# Patient Record
Sex: Female | Born: 1975
Health system: Southern US, Community
[De-identification: ages and names within clinical notes are randomized; demographics above are authoritative.]

## PROBLEM LIST (undated history)

## (undated) ENCOUNTER — Inpatient Hospital Stay (HOSPITAL_COMMUNITY): Payer: Self-pay

## (undated) DIAGNOSIS — J45909 Unspecified asthma, uncomplicated: Secondary | ICD-10-CM

## (undated) DIAGNOSIS — F988 Other specified behavioral and emotional disorders with onset usually occurring in childhood and adolescence: Secondary | ICD-10-CM

## (undated) DIAGNOSIS — Z9889 Other specified postprocedural states: Secondary | ICD-10-CM

## (undated) DIAGNOSIS — F419 Anxiety disorder, unspecified: Secondary | ICD-10-CM

## (undated) DIAGNOSIS — R112 Nausea with vomiting, unspecified: Secondary | ICD-10-CM

## (undated) DIAGNOSIS — R87629 Unspecified abnormal cytological findings in specimens from vagina: Secondary | ICD-10-CM

## (undated) HISTORY — PX: LEEP: SHX91

## (undated) HISTORY — DX: Unspecified abnormal cytological findings in specimens from vagina: R87.629

## (undated) HISTORY — PX: OTHER SURGICAL HISTORY: SHX169

## (undated) HISTORY — PX: APPENDECTOMY: SHX54

## (undated) HISTORY — PX: RHINOPLASTY: SUR1284

## (undated) HISTORY — PX: WISDOM TOOTH EXTRACTION: SHX21

## (undated) HISTORY — DX: Other specified behavioral and emotional disorders with onset usually occurring in childhood and adolescence: F98.8

---

## 1999-10-25 ENCOUNTER — Encounter (INDEPENDENT_AMBULATORY_CARE_PROVIDER_SITE_OTHER): Payer: Self-pay | Admitting: Specialist

## 1999-10-25 ENCOUNTER — Other Ambulatory Visit: Admission: RE | Admit: 1999-10-25 | Discharge: 1999-10-25 | Payer: Self-pay | Admitting: Gynecology

## 1999-11-16 ENCOUNTER — Encounter (INDEPENDENT_AMBULATORY_CARE_PROVIDER_SITE_OTHER): Payer: Self-pay | Admitting: Specialist

## 1999-11-16 ENCOUNTER — Ambulatory Visit (HOSPITAL_COMMUNITY): Admission: RE | Admit: 1999-11-16 | Discharge: 1999-11-16 | Payer: Self-pay | Admitting: Gynecology

## 2000-07-23 ENCOUNTER — Other Ambulatory Visit: Admission: RE | Admit: 2000-07-23 | Discharge: 2000-07-23 | Payer: Self-pay | Admitting: Gynecology

## 2000-07-24 ENCOUNTER — Encounter (INDEPENDENT_AMBULATORY_CARE_PROVIDER_SITE_OTHER): Payer: Self-pay

## 2000-07-24 ENCOUNTER — Other Ambulatory Visit: Admission: RE | Admit: 2000-07-24 | Discharge: 2000-07-24 | Payer: Self-pay | Admitting: Gynecology

## 2001-01-22 ENCOUNTER — Other Ambulatory Visit: Admission: RE | Admit: 2001-01-22 | Discharge: 2001-01-22 | Payer: Self-pay | Admitting: Gynecology

## 2001-11-02 ENCOUNTER — Other Ambulatory Visit: Admission: RE | Admit: 2001-11-02 | Discharge: 2001-11-02 | Payer: Self-pay | Admitting: *Deleted

## 2001-12-17 ENCOUNTER — Inpatient Hospital Stay (HOSPITAL_COMMUNITY): Admission: AD | Admit: 2001-12-17 | Discharge: 2001-12-17 | Payer: Self-pay | Admitting: Gynecology

## 2002-03-24 ENCOUNTER — Inpatient Hospital Stay (HOSPITAL_COMMUNITY): Admission: AD | Admit: 2002-03-24 | Discharge: 2002-03-26 | Payer: Self-pay | Admitting: Gynecology

## 2002-03-27 ENCOUNTER — Encounter: Admission: RE | Admit: 2002-03-27 | Discharge: 2002-04-26 | Payer: Self-pay | Admitting: Gynecology

## 2002-04-27 ENCOUNTER — Encounter: Admission: RE | Admit: 2002-04-27 | Discharge: 2002-05-27 | Payer: Self-pay | Admitting: Gynecology

## 2003-03-28 ENCOUNTER — Other Ambulatory Visit: Admission: RE | Admit: 2003-03-28 | Discharge: 2003-03-28 | Payer: Self-pay | Admitting: Obstetrics and Gynecology

## 2004-04-24 ENCOUNTER — Other Ambulatory Visit: Admission: RE | Admit: 2004-04-24 | Discharge: 2004-04-24 | Payer: Self-pay | Admitting: Obstetrics and Gynecology

## 2004-06-02 ENCOUNTER — Inpatient Hospital Stay (HOSPITAL_COMMUNITY): Admission: AD | Admit: 2004-06-02 | Discharge: 2004-06-02 | Payer: Self-pay | Admitting: Obstetrics and Gynecology

## 2004-06-20 ENCOUNTER — Ambulatory Visit (HOSPITAL_COMMUNITY): Admission: RE | Admit: 2004-06-20 | Discharge: 2004-06-20 | Payer: Self-pay | Admitting: Obstetrics and Gynecology

## 2004-11-23 ENCOUNTER — Inpatient Hospital Stay (HOSPITAL_COMMUNITY): Admission: AD | Admit: 2004-11-23 | Discharge: 2004-11-23 | Payer: Self-pay | Admitting: Obstetrics and Gynecology

## 2004-12-14 ENCOUNTER — Inpatient Hospital Stay (HOSPITAL_COMMUNITY): Admission: AD | Admit: 2004-12-14 | Discharge: 2004-12-17 | Payer: Self-pay | Admitting: Obstetrics and Gynecology

## 2004-12-21 ENCOUNTER — Encounter: Admission: RE | Admit: 2004-12-21 | Discharge: 2005-01-20 | Payer: Self-pay | Admitting: Obstetrics and Gynecology

## 2004-12-21 ENCOUNTER — Inpatient Hospital Stay (HOSPITAL_COMMUNITY): Admission: AD | Admit: 2004-12-21 | Discharge: 2004-12-21 | Payer: Self-pay | Admitting: Obstetrics and Gynecology

## 2006-06-25 ENCOUNTER — Other Ambulatory Visit: Admission: RE | Admit: 2006-06-25 | Discharge: 2006-06-25 | Payer: Self-pay | Admitting: Obstetrics and Gynecology

## 2009-05-10 ENCOUNTER — Ambulatory Visit (HOSPITAL_COMMUNITY): Admission: EM | Admit: 2009-05-10 | Discharge: 2009-05-11 | Payer: Self-pay | Admitting: Emergency Medicine

## 2009-05-11 ENCOUNTER — Encounter (INDEPENDENT_AMBULATORY_CARE_PROVIDER_SITE_OTHER): Payer: Self-pay | Admitting: General Surgery

## 2010-05-27 ENCOUNTER — Emergency Department (HOSPITAL_COMMUNITY)
Admission: EM | Admit: 2010-05-27 | Discharge: 2010-05-27 | Payer: Self-pay | Source: Home / Self Care | Admitting: Emergency Medicine

## 2010-05-29 ENCOUNTER — Ambulatory Visit (HOSPITAL_BASED_OUTPATIENT_CLINIC_OR_DEPARTMENT_OTHER)
Admission: RE | Admit: 2010-05-29 | Discharge: 2010-05-29 | Payer: Self-pay | Source: Home / Self Care | Admitting: Otolaryngology

## 2010-11-01 LAB — POCT HEMOGLOBIN-HEMACUE: Hemoglobin: 12.9 g/dL (ref 12.0–15.0)

## 2010-11-23 LAB — COMPREHENSIVE METABOLIC PANEL
ALT: 14 U/L (ref 0–35)
AST: 16 U/L (ref 0–37)
Albumin: 4.5 g/dL (ref 3.5–5.2)
Alkaline Phosphatase: 40 U/L (ref 39–117)
BUN: 14 mg/dL (ref 6–23)
CO2: 27 mEq/L (ref 19–32)
Calcium: 9.5 mg/dL (ref 8.4–10.5)
Chloride: 103 mEq/L (ref 96–112)
Creatinine, Ser: 0.83 mg/dL (ref 0.4–1.2)
GFR calc Af Amer: >60 mL/min (ref >60)
GFR calc non Af Amer: >60 mL/min (ref >60)
Glucose, Bld: 97 mg/dL (ref 70–99)
Potassium: 3.7 mEq/L (ref 3.5–5.1)
Sodium: 139 mEq/L (ref 135–145)
Total Bilirubin: 1.3 mg/dL — ABNORMAL HIGH (ref 0.3–1.2)
Total Protein: 7.5 g/dL (ref 6.0–8.3)

## 2010-11-23 LAB — DIFFERENTIAL
Basophils Absolute: 0 10*3/uL (ref 0.0–0.1)
Basophils Relative: 0 % (ref 0–1)
Eosinophils Absolute: 0.2 10*3/uL (ref 0.0–0.7)
Eosinophils Relative: 1 % (ref 0–5)
Lymphocytes Relative: 9 % — ABNORMAL LOW (ref 12–46)
Lymphs Abs: 1.2 10*3/uL (ref 0.7–4.0)
Monocytes Absolute: 0.9 10*3/uL (ref 0.1–1.0)
Monocytes Relative: 7 % (ref 3–12)
Neutro Abs: 10.4 10*3/uL — ABNORMAL HIGH (ref 1.7–7.7)
Neutrophils Relative %: 82 % — ABNORMAL HIGH (ref 43–77)

## 2010-11-23 LAB — URINALYSIS, ROUTINE W REFLEX MICROSCOPIC
Bilirubin Urine: NEGATIVE
Glucose, UA: NEGATIVE mg/dL
Hgb urine dipstick: NEGATIVE
Ketones, ur: NEGATIVE mg/dL
Nitrite: NEGATIVE
Protein, ur: NEGATIVE mg/dL
Specific Gravity, Urine: 1.022 (ref 1.005–1.030)
Urobilinogen, UA: 1 mg/dL (ref 0.0–1.0)
pH: 8 (ref 5.0–8.0)

## 2010-11-23 LAB — TYPE AND SCREEN
ABO/RH(D): O POS
Antibody Screen: NEGATIVE

## 2010-11-23 LAB — CBC
HCT: 38.8 % (ref 36.0–46.0)
Hemoglobin: 13.3 g/dL (ref 12.0–15.0)
MCHC: 34.2 g/dL (ref 30.0–36.0)
MCV: 100.6 fL — ABNORMAL HIGH (ref 78.0–100.0)
Platelets: 252 10*3/uL (ref 150–400)
RBC: 3.86 MIL/uL — ABNORMAL LOW (ref 3.87–5.11)
RDW: 12.7 % (ref 11.5–15.5)
WBC: 12.7 10*3/uL — ABNORMAL HIGH (ref 4.0–10.5)

## 2010-11-23 LAB — ABO/RH: ABO/RH(D): O POS

## 2010-11-23 LAB — PROTIME-INR
INR: 1.2 (ref 0.00–1.49)
Prothrombin Time: 15.3 seconds — ABNORMAL HIGH (ref 11.6–15.2)

## 2010-11-23 LAB — POCT PREGNANCY, URINE: Preg Test, Ur: NEGATIVE

## 2010-11-23 LAB — LIPASE, BLOOD: Lipase: 21 U/L (ref 11–59)

## 2010-11-23 LAB — APTT: aPTT: 31 seconds (ref 24–37)

## 2011-01-04 NOTE — H&P (Signed)
Stacey [name], Stacey [name]                ACCOUNT NO.:  [id number]   MEDICAL RECORD NO.:  [id number]          PATIENT TYPE:  INP   LOCATION:  9164                          [facility]ILITY:  [facility]   PHYSICIAN:  Naima A. Dillard, M.D. DATE OF BIRTH:  07/07/1976   DATE OF ADMISSION:  12/14/2004  DATE OF DISCHARGE:                                HISTORY & PHYSICAL   Ms. Stacey [name] is a 35 year old, married, white female, gravida 2, para 1-0-0-  1, at 39-0/7th weeks, who presents complaining of leaking large vaginal  fluid at around 9 p.m. with onset of uterine contractions of increased  intensity and frequency since about 10 p.m.  She was 4-cm at the office  yesterday.  She denies any nausea, vomiting, headaches, or visual  disturbances.   Her pregnancy has been followed at [facility]  by the M.D.  service and has been essentially uncomplicated though at risk for:  1.  First trimester spotting.  2.  History of asthma.  3.  History of cold knife conization of her cervix in 2001.  4.  History of some social issues throughout this pregnancy though currently      improving.   Her Group B strep is negative.   OBSTETRICAL/GYNECOLOGIC HISTORY:  1.  She is a gravida 2, para 1-0-0-1, who delivered a viable female infant, in      August 2003, who weighed 6 pounds 8 ounces at 39 weeks' gestation      without complication.  2.  Abnormal Pap in 2000, followed by a LEEP procedure and possibly a cold      knife conization.  3.  She was diagnosed with HPV in 1997.   GENERAL MEDICAL HISTORY:  1.  She has no known drug allergies.  2.  She reports having had the usual childhood diseases.  3.  She has a history of asthma with occasional use of inhaler.  4.  History of anemia.  5.  History of stomach sensitivity.  6.  Her only hospitalization was for childbirth.   FAMILY HISTORY:  Significant for maternal grandfather with MI.  Mother with  hypertension on medications.  Mother and paternal aunt and  paternal  grandmother with varicosities.  Maternal grandmother with emphysema.  Mother  with premenopausal breast cancer.   GENETIC HISTORY:  Negative.   SOCIAL HISTORY:  She is married to [employer] who is involved and  supportive.  They are both employed full time.  They deny any illicit drug  use, alcohol, or smoking with this pregnancy.  They are of the [place]  faith.   PRENATAL LABS:  Her blood type is O positive.  Her antibody screen is  negative.  Syphilis is nonreactive.  Rubella is immune.  Hepatitis B surface  antigen is negative.  GC and Chlamydia are both negative.  Pap was within  normal limits.  Her 36-week beta strep was negative.   PHYSICAL EXAMINATION:  VITAL SIGNS:  Stable.  She is afebrile.  HEENT:  Grossly within normal limits.  HEART:  Regular rhythm and rate.  CHEST:  Clear.  BREASTS:  Soft and nontender.  ABDOMEN:  Gravid with uterine contractions every three to four minutes.  The  fetal heart rate is reactive and reassuring.  PELVIC:  Is 6-cm, vertex, 90%, with clear fluid noted per the RN on labor  and delivery.  EXTREMITIES:  Within normal limits.   ASSESSMENT:  1.  Intrauterine pregnancy at 46 weeks' gestation.  2.  Spontaneous rupture of the membranes with clear fluid.  3.  Active labor.  4.  Desires epidural for labor.  5.  Negative Group B strep.   PLAN:  1.  Admit to labor and delivery.  2.  Follow routine M.D. orders.  3.  Dr. Normand Sloop has been notified of the patient's admission and will follow      the patient.      SJD/MEDQ  D:  12/14/2004  T:  12/14/2004  Job:  [id number]

## 2011-01-04 NOTE — H&P (Signed)
Stacey [name], Stacey [name]                          ACCOUNT NO.:  [id number]   MEDICAL RECORD NO.:  [id number]                   PATIENT TYPE:  INP   LOCATION:  9163                                 [facility]   PHYSICIAN:  Juan H. Lily Peer, M.D.             DATE OF BIRTH:  12/30/1975   DATE OF ADMISSION:  03/24/2002  DATE OF DISCHARGE:                                HISTORY & PHYSICAL   CHIEF COMPLAINT:  Labor.   HISTORY OF PRESENT ILLNESS:  The patient is a 35 year old gravida 1, para 1,  with a corrected estimated date of confinement being March 29, 2002.  The  patient currently is 39-1/2 weeks intrauterine pregnancy.  Was seen in the  office yesterday, was 2 cm dilated, had her membranes stripped and had  bloody showing during the evening and presented early this morning to  [facility] in labor.  Her cervix was found to be 3-4 cm dilated, 90%  effaced, and contracting every three to five minutes and was admitted.  The  patient this morning had bulging membranes with contractions spread out to  every five to six minutes apart, and her cervix was found to be 5-6 cm  dilated.  She had an epidural in place and a reassuring fetal heart rate  tracing, stable vital signs.  Her prenatal course essentially had been  unremarkable with the exception that she had bacterial vaginosis and was  treated with Flagyl in her second trimester, and her GBS status was  negative.   PAST MEDICAL HISTORY:  The patient conceived on oral contraceptive pills,  thus the reason for the ultrasound that confirmed her due date of March 29, 2002.  She denied any allergies.  She had a LEEP cervical conization in 2001  for dysplasia.   REVIEW OF SYMPTOMS:  See [place] form.   PRENATAL LABORATORY DATA:  Blood type O positive.  Negative antibody screen.  VDRL, hepatitis B surface antigen, and HIV were negative.  Rubella titer  with evidence of immunity.  Pap smear was normal.  Alpha-fetoprotein was  normal.  GBS culture was negative.  Pap smear report not in chart as well as  the diabetes screen not in the chart either.   PHYSICAL EXAMINATION:  VITAL SIGNS:  Blood pressure 89/55, 88/55, 88/53.  Review of her prenatal history demonstrated the patient typically carries a  low blood pressure.  Her pulse was 66, respirations 18, temperature 96.6.  HEENT:  Unremarkable.  NECK:  Supple.  Trachea is midline.  No carotid bruits.  No thyromegaly.  LUNGS:  Clear to auscultation.  Without rhonchi or wheezes.  HEART:  Regular rate and rhythm.  No murmurs or gallops.  BREASTS:  Not done.  ABDOMEN:  Gravid uterus.  Vertex presentation by [facility] maneuver.  PELVIC:  Cervix was 6-7 cm, bulging membranes, 90% effaced, -3 to -2  station.  EXTREMITIES:  DTRs 1+.  Negative clonus.  Trace edema.   ASSESSMENT:  This is a 35 year old gravida 1, para 0, at 39-1/2 estimated  gestational age.  Advanced cervical dilatation.  In labor earlier on  admission, now with protracted labor.  Underwent artificial rupture of  membranes with clear amniotic fluid.  Fetal scalp electrode and IUPC were  placed.  GBS culture was negative.  Cervix 6-7 cm dilated, 90% effaced, -3  to -2 station.  Reassuring fetal heart rate tracing.  Contractions every  five to six minutes  apart.  Will wait one hour.  If spontaneous contractions do not increase in  frequency and intensity we will proceed then with Pitocin augmentation.   PLAN:  As per assessment above.  Anticipate vaginal delivery.                                               Juan H. Lily Peer, M.D.    JHF/MEDQ  D:  03/24/2002  T:  03/24/2002  Job:  [id number]

## 2011-01-04 NOTE — Discharge Summary (Signed)
   [name], [name]                          ACCOUNT NO.:  [id number]   MEDICAL RECORD NO.:  [id number]                   PATIENT TYPE:  INP   LOCATION:  9141                                 [facility]   PHYSICIAN:  Ivor Costa. Farrel Gobble, M.D.              DATE OF BIRTH:  [birth year]   DATE OF ADMISSION:  03/24/2002  DATE OF DISCHARGE:  03/26/2002                                 DISCHARGE SUMMARY   DISCHARGE DIAGNOSES:  1. Intrauterine pregnancy 39 weeks delivered.  2. Spontaneous vaginal delivery.   HISTORY:  A 35 year old white female gravida 1, para 0 with an estimated  date of confinement on 03/29/02.  Prenatal course was uncomplicated.   HOSPITAL COURSE:  On 03/24/02 the patient was noted in labor.  Thus, on  03/24/02 the patient at 1-1-1-2 underwent a spontaneous vaginal delivery of  a female with Apgars of 8 and 9.  Weight 613 ounces.  There was no episiotomy  but a bilateral perineal tear and this was repaired without difficulty.  Postpartum the patient was afebrile, voiding and in stable condition.  She  was discharged home on 03/26/02 given [facility] _________  postpartum booklet.   LABORATORY DATA:  The patient is rubella immune.  On 03/25/02 hemoglobin was  10.1, hematocrit 28.6.   DISPOSITION:  The patient was discharged home.  She is to take Motrin over-  the-counter p.r.n. pain.     Susa Loffler, P.A.                    Ivor Costa. Farrel Gobble, M.D.    TSG/MEDQ  D:  04/30/2002  T:  04/30/2002  Job:  [id number]

## 2011-11-29 ENCOUNTER — Other Ambulatory Visit (INDEPENDENT_AMBULATORY_CARE_PROVIDER_SITE_OTHER): Payer: PRIVATE HEALTH INSURANCE

## 2011-11-29 DIAGNOSIS — Z3009 Encounter for other general counseling and advice on contraception: Secondary | ICD-10-CM

## 2011-11-29 MED ORDER — MEDROXYPROGESTERONE ACETATE 150 MG/ML IM SUSP
150.0000 mg | Freq: Once | INTRAMUSCULAR | Status: AC
  Administered 2011-11-29: 150 mg via INTRAMUSCULAR

## 2011-11-29 NOTE — Progress Notes (Signed)
Next Depo Due 02-21-2012

## 2012-03-19 ENCOUNTER — Telehealth: Payer: Self-pay | Admitting: Obstetrics and Gynecology

## 2012-03-20 ENCOUNTER — Other Ambulatory Visit: Payer: PRIVATE HEALTH INSURANCE

## 2014-10-21 ENCOUNTER — Other Ambulatory Visit: Payer: Self-pay | Admitting: Registered Nurse

## 2014-10-21 ENCOUNTER — Other Ambulatory Visit (HOSPITAL_COMMUNITY)
Admission: RE | Admit: 2014-10-21 | Discharge: 2014-10-21 | Disposition: A | Discharge status: Home / Self Care | Payer: 59 | Source: Ambulatory Visit | Attending: Internal Medicine | Admitting: Internal Medicine

## 2014-10-21 DIAGNOSIS — Z01419 Encounter for gynecological examination (general) (routine) without abnormal findings: Secondary | ICD-10-CM | POA: Insufficient documentation

## 2014-10-25 LAB — CYTOLOGY - PAP

## 2014-10-27 ENCOUNTER — Other Ambulatory Visit: Payer: Self-pay

## 2014-10-27 DIAGNOSIS — Z1231 Encounter for screening mammogram for malignant neoplasm of breast: Secondary | ICD-10-CM

## 2014-11-04 ENCOUNTER — Ambulatory Visit
Admission: RE | Admit: 2014-11-04 | Discharge: 2014-11-04 | Disposition: A | Discharge status: Home / Self Care | Payer: 59 | Source: Ambulatory Visit

## 2014-11-04 DIAGNOSIS — Z1231 Encounter for screening mammogram for malignant neoplasm of breast: Secondary | ICD-10-CM

## 2015-03-04 ENCOUNTER — Encounter (HOSPITAL_COMMUNITY): Payer: Self-pay | Admitting: *Deleted

## 2015-03-04 ENCOUNTER — Inpatient Hospital Stay (HOSPITAL_COMMUNITY)
Admission: AD | Admit: 2015-03-04 | Discharge: 2015-03-05 | Disposition: A | Discharge status: Home / Self Care | Payer: 59 | Source: Ambulatory Visit | Attending: Obstetrics & Gynecology | Admitting: Obstetrics & Gynecology

## 2015-03-04 DIAGNOSIS — R109 Unspecified abdominal pain: Secondary | ICD-10-CM | POA: Diagnosis not present

## 2015-03-04 DIAGNOSIS — N831 Corpus luteum cyst of ovary, unspecified side: Secondary | ICD-10-CM

## 2015-03-04 DIAGNOSIS — O418X10 Other specified disorders of amniotic fluid and membranes, first trimester, not applicable or unspecified: Secondary | ICD-10-CM

## 2015-03-04 DIAGNOSIS — O9989 Other specified diseases and conditions complicating pregnancy, childbirth and the puerperium: Secondary | ICD-10-CM | POA: Diagnosis not present

## 2015-03-04 DIAGNOSIS — O26899 Other specified pregnancy related conditions, unspecified trimester: Secondary | ICD-10-CM

## 2015-03-04 DIAGNOSIS — Z3A01 Less than 8 weeks gestation of pregnancy: Secondary | ICD-10-CM | POA: Diagnosis not present

## 2015-03-04 DIAGNOSIS — O208 Other hemorrhage in early pregnancy: Secondary | ICD-10-CM | POA: Diagnosis not present

## 2015-03-04 DIAGNOSIS — O468X1 Other antepartum hemorrhage, first trimester: Secondary | ICD-10-CM

## 2015-03-04 DIAGNOSIS — O209 Hemorrhage in early pregnancy, unspecified: Secondary | ICD-10-CM

## 2015-03-04 HISTORY — DX: Nausea with vomiting, unspecified: R11.2

## 2015-03-04 HISTORY — DX: Other specified postprocedural states: Z98.890

## 2015-03-04 HISTORY — DX: Anxiety disorder, unspecified: F41.9

## 2015-03-04 LAB — OB RESULTS CONSOLE GC/CHLAMYDIA
CHLAMYDIA, DNA PROBE: NEGATIVE
GC PROBE AMP, GENITAL: NEGATIVE

## 2015-03-04 LAB — POCT PREGNANCY, URINE: Preg Test, Ur: POSITIVE — AB

## 2015-03-04 NOTE — MAU Note (Signed)
PT  SAYS SHE STARTED  HAVING  STRETCH PAIN ON LEFT  SIDE-   AT 1030AM-  PAIN  STOPPED   THEN  AFTERNOON-   PAIN IN LOWER  ABD -  THEN   TONIGHT  AT 2200-  SHARP PAIN IN LEFT  SIDE  AND PAIN ON RIGHT  SIDE .-   NO MED  FOR  PAIN.     YESTERDAY  HAD PINK  D/C  WHEN WIPING .  THIS AM  BROWNISH  RED  WHEN WIPED.     THEN TONIGHT AT 2200- HAD  BROWN D/C   WHEN  WIPED.     WAS IN [facility] OFFICE  -   4 YEARS  AGO   FIRST APPOINTMENT   IS 7-25  FOR INTERVIEW   .     LAST SEX-     7-11.     HPT   DONE  6-25  - POSITIVE.

## 2015-03-05 ENCOUNTER — Inpatient Hospital Stay (HOSPITAL_COMMUNITY): Payer: 59

## 2015-03-05 DIAGNOSIS — O9989 Other specified diseases and conditions complicating pregnancy, childbirth and the puerperium: Secondary | ICD-10-CM | POA: Diagnosis not present

## 2015-03-05 DIAGNOSIS — R109 Unspecified abdominal pain: Secondary | ICD-10-CM | POA: Diagnosis not present

## 2015-03-05 DIAGNOSIS — Z3A01 Less than 8 weeks gestation of pregnancy: Secondary | ICD-10-CM

## 2015-03-05 LAB — CBC WITH DIFFERENTIAL/PLATELET
Basophils Absolute: 0 10*3/uL (ref 0.0–0.1)
Basophils Relative: 0 % (ref 0–1)
Eosinophils Absolute: 0.3 10*3/uL (ref 0.0–0.7)
Eosinophils Relative: 3 % (ref 0–5)
HCT: 35.3 % — ABNORMAL LOW (ref 36.0–46.0)
Hemoglobin: 12.1 g/dL (ref 12.0–15.0)
Lymphocytes Relative: 26 % (ref 12–46)
Lymphs Abs: 2.7 10*3/uL (ref 0.7–4.0)
MCH: 33 pg (ref 26.0–34.0)
MCHC: 34.3 g/dL (ref 30.0–36.0)
MCV: 96.2 fL (ref 78.0–100.0)
MONO ABS: 1.1 10*3/uL — AB (ref 0.1–1.0)
Monocytes Relative: 10 % (ref 3–12)
Neutro Abs: 6.2 10*3/uL (ref 1.7–7.7)
Neutrophils Relative %: 61 % (ref 43–77)
PLATELETS: 266 10*3/uL (ref 150–400)
RBC: 3.67 MIL/uL — ABNORMAL LOW (ref 3.87–5.11)
RDW: 12.1 % (ref 11.5–15.5)
WBC: 10.3 10*3/uL (ref 4.0–10.5)

## 2015-03-05 LAB — URINALYSIS, ROUTINE W REFLEX MICROSCOPIC
Bilirubin Urine: NEGATIVE
Glucose, UA: NEGATIVE mg/dL
Ketones, ur: NEGATIVE mg/dL
Leukocytes, UA: NEGATIVE
Nitrite: NEGATIVE
Protein, ur: NEGATIVE mg/dL
Specific Gravity, Urine: 1.01 (ref 1.005–1.030)
Urobilinogen, UA: 0.2 mg/dL (ref 0.0–1.0)
pH: 6 (ref 5.0–8.0)

## 2015-03-05 LAB — WET PREP, GENITAL
Clue Cells Wet Prep HPF POC: NONE SEEN
Trich, Wet Prep: NONE SEEN
Yeast Wet Prep HPF POC: NONE SEEN

## 2015-03-05 LAB — URINE MICROSCOPIC-ADD ON

## 2015-03-05 LAB — HIV ANTIBODY (ROUTINE TESTING W REFLEX): HIV SCREEN 4TH GENERATION: NONREACTIVE

## 2015-03-05 LAB — ABO/RH: ABO/RH(D): O POS

## 2015-03-05 LAB — HCG, QUANTITATIVE, PREGNANCY: HCG, BETA CHAIN, QUANT, S: 112729 m[IU]/mL — AB (ref <5)

## 2015-03-05 NOTE — Discharge Instructions (Signed)
You have a right corpus luteum cyst. This is a normal cyst of pregnancy.   Subchorionic Hematoma A subchorionic hematoma is a gathering of blood between the outer wall of the placenta and the inner wall of the womb (uterus). The placenta is the organ that connects the fetus to the wall of the uterus. The placenta performs the feeding, breathing (oxygen to the fetus), and waste removal (excretory work) of the fetus.  Subchorionic hematoma is the most common abnormality found on a result from ultrasonography done during the first trimester or early second trimester of pregnancy. If there has been little or no vaginal bleeding, early small hematomas usually shrink on their own and do not affect your baby or pregnancy. The blood is gradually absorbed over 1-2 weeks. When bleeding starts later in pregnancy or the hematoma is larger or occurs in an older pregnant woman, the outcome may not be as good. Larger hematomas may get bigger, which increases the chances for miscarriage. Subchorionic hematoma also increases the risk of premature detachment of the placenta from the uterus, preterm (premature) labor, and stillbirth. HOME CARE INSTRUCTIONS  Avoid heavy lifting (more than 10 lb [4.5 kg]), strenuous exercise, sexual intercourse, or douching as directed by your health care provider.  Keep track of the number of pads you use each day and how soaked (saturated) they are. Write down this information.  Do not use tampons.  Keep all follow-up appointments as directed by your health care provider. Your health care provider may ask you to have follow-up blood tests or ultrasound tests or both. SEEK IMMEDIATE MEDICAL CARE IF:  You have severe cramps in your stomach, back, abdomen, or pelvis.  You have a fever.  You pass large clots or tissue. Save any tissue for your health care provider to look at.  Your bleeding increases or you become lightheaded, feel weak, or have fainting episodes. Document Released:  11/20/2006 Document Revised: 12/20/2013 Document Reviewed: 03/04/2013 [facility] Patient Information 2015 [employer] [place]. This information is not intended to replace advice given to you by your health care provider. Make sure you discuss any questions you have with your health care provider.   Abdominal Pain During Pregnancy Abdominal pain is common in pregnancy. Most of the time, it does not cause harm. There are many causes of abdominal pain. Some causes are more serious than others. Some of the causes of abdominal pain in pregnancy are easily diagnosed. Occasionally, the diagnosis takes time to understand. Other times, the cause is not determined. Abdominal pain can be a sign that something is very wrong with the pregnancy, or the pain may have nothing to do with the pregnancy at all. For this reason, always tell your health care provider if you have any abdominal discomfort. HOME CARE INSTRUCTIONS  Monitor your abdominal pain for any changes. The following actions may help to alleviate any discomfort you are experiencing:  Do not have sexual intercourse or put anything in your vagina until your symptoms go away completely.  Get plenty of rest until your pain improves.  Drink clear fluids if you feel nauseous. Avoid solid food as long as you are uncomfortable or nauseous.  Only take over-the-counter or prescription medicine as directed by your health care provider.  Keep all follow-up appointments with your health care provider. SEEK IMMEDIATE MEDICAL CARE IF:  You are bleeding, leaking fluid, or passing tissue from the vagina.  You have increasing pain or cramping.  You have persistent vomiting.  You have painful or bloody  urination.  You have a fever.  You notice a decrease in your baby's movements.  You have extreme weakness or feel faint.  You have shortness of breath, with or without abdominal pain.  You develop a severe headache with abdominal pain.  You have abnormal  vaginal discharge with abdominal pain.  You have persistent diarrhea.  You have abdominal pain that continues even after rest, or gets worse. MAKE SURE YOU:   Understand these instructions.  Will watch your condition.  Will get help right away if you are not doing well or get worse. Document Released: 08/05/2005 Document Revised: 05/26/2013 Document Reviewed: 03/04/2013 [employer] Patient Information 2015 [place], [place]. This information is not intended to replace advice given to you by your health care provider. Make sure you discuss any questions you have with your health care provider.

## 2015-03-05 NOTE — MAU Provider Note (Signed)
History     CSN: [id number]  Arrival date and time: 03/04/15 2301   First Provider Initiated Contact with Patient 03/05/15 [phone]    Chief complaint vaginal bleeding and abdominal pain in pregnancy.   Vaginal Bleeding The patient's primary symptoms include vaginal bleeding. The patient's pertinent negatives include no genital lesions, genital odor, pelvic pain or vaginal discharge. This is a new problem. The current episode started yesterday. The problem occurs intermittently. The problem has been unchanged. The pain is mild. The problem affects both sides. She is pregnant. Associated symptoms include abdominal pain. Pertinent negatives include no back pain, chills, constipation, diarrhea, dysuria, fever, flank pain, frequency, hematuria, nausea, painful intercourse, urgency or vomiting. The vaginal discharge was brown. The vaginal bleeding is spotting. She has not been passing clots. She has not been passing tissue. Nothing aggravates the symptoms. She has tried nothing for the symptoms. She is sexually active. Contraceptive use: Pregnant. Her past medical history is significant for an abdominal surgery. There is no history of a Cesarean section. (LEEP)   No recent intercourse.  OB History    Gravida Para Term Preterm AB TAB SAB Ectopic Multiple Living   4 2   1 1    2       Past Medical History  Diagnosis Date  . PONV (postoperative nausea and vomiting)   . Anxiety     Past Surgical History  Procedure Laterality Date  . Appendectomy    . Wisdom tooth extraction    . Leep    . Leep    . Rhinoplasty      History reviewed. No pertinent family history.  History  Substance Use Topics  . Smoking status: Former Smoker -- 0.25 packs/day  . Smokeless tobacco: Not on file  . Alcohol Use: No    Allergies:  Allergies  Allergen Reactions  . Stadol [Butorphanol] Other (See Comments)    Halucinating     Prescriptions prior to admission  Medication Sig Dispense Refill Last Dose   . Biotin 10 MG TABS Take by mouth.     . Prenatal Vit-Fe Fumarate-FA (PRENATAL MULTIVITAMIN) TABS tablet Take 1 tablet by mouth daily at 12 noon.   03/03/2015 at Unknown time    Review of Systems  Constitutional: Negative for fever and chills.  Gastrointestinal: Positive for abdominal pain. Negative for nausea, vomiting, diarrhea and constipation.  Genitourinary: Positive for vaginal bleeding. Negative for dysuria, urgency, frequency, hematuria, flank pain, vaginal discharge and pelvic pain.       Positive for small amount of pink and brown vaginal bleeding. Negative for passage of tissue or leaking of fluid.  Musculoskeletal: Negative for back pain.   Physical Exam   Blood pressure 102/67, pulse 75, temperature 98.3 F (36.8 C), resp. rate 20, height 5\' 6"  (1.676 m), weight 128 lb (58.06 kg), last menstrual period 01/14/2015.  Physical Exam  Constitutional: She appears well-developed and well-nourished. No distress.  HENT:  Head: Normocephalic.  Eyes: Conjunctivae are normal.  Cardiovascular: Normal rate and regular rhythm.   Respiratory: Effort normal. No respiratory distress.  GI: Soft. She exhibits no distension and no mass. There is no tenderness. There is no rebound and no guarding.  Genitourinary: There is no rash or lesion on the right labia. There is no rash or lesion on the left labia. Uterus is enlarged. Uterus is not tender. Cervix exhibits no motion tenderness, no discharge and no friability. Right adnexum displays no mass, no tenderness and no fullness. Left adnexum displays no mass,  no tenderness and no fullness. There is bleeding in the vagina. No erythema or tenderness in the vagina. No signs of injury around the vagina. No vaginal discharge found.  Scant amount of tan discharge.    Results for orders placed or performed during the hospital encounter of 03/04/15 (from the past 336 hour(s))  Urinalysis, Routine w reflex microscopic (not at [facility] (2-Rh))   Collection Time:  03/04/15 11:27 PM  Result Value Ref Range   Color, Urine YELLOW YELLOW   APPearance CLEAR CLEAR   Specific Gravity, Urine 1.010 1.005 - 1.030   pH 6.0 5.0 - 8.0   Glucose, UA NEGATIVE NEGATIVE mg/dL   Hgb urine dipstick SMALL (A) NEGATIVE   Bilirubin Urine NEGATIVE NEGATIVE   Ketones, ur NEGATIVE NEGATIVE mg/dL   Protein, ur NEGATIVE NEGATIVE mg/dL   Urobilinogen, UA 0.2 0.0 - 1.0 mg/dL   Nitrite NEGATIVE NEGATIVE   Leukocytes, UA NEGATIVE NEGATIVE  Urine microscopic-add on   Collection Time: 03/04/15 11:27 PM  Result Value Ref Range   Squamous Epithelial / LPF RARE RARE   WBC, UA 0-2 <3 WBC/hpf   RBC / HPF 0-2 <3 RBC/hpf   Bacteria, UA RARE RARE  Pregnancy, urine POC   Collection Time: 03/04/15 11:36 PM  Result Value Ref Range   Preg Test, Ur POSITIVE (A) NEGATIVE  GC/Chlamydia probe amp ([place])not at [facility]   Collection Time: 03/05/15 12:00 AM  Result Value Ref Range   Chlamydia Negative    Neisseria gonorrhea Negative   Wet prep, genital   Collection Time: 03/05/15 12:07 AM  Result Value Ref Range   Yeast Wet Prep HPF POC NONE SEEN NONE SEEN   Trich, Wet Prep NONE SEEN NONE SEEN   Clue Cells Wet Prep HPF POC NONE SEEN NONE SEEN   WBC, Wet Prep HPF POC FEW (A) NONE SEEN  hCG, quantitative, pregnancy   Collection Time: 03/05/15 12:55 AM  Result Value Ref Range   hCG, Beta Chain, Quant, S 161096 (H) <5 mIU/mL  CBC with Differential/Platelet   Collection Time: 03/05/15 12:55 AM  Result Value Ref Range   WBC 10.3 4.0 - 10.5 K/uL   RBC 3.67 (L) 3.87 - 5.11 MIL/uL   Hemoglobin 12.1 12.0 - 15.0 g/dL   HCT 04.5 (L) 40.9 - 81.1 %   MCV 96.2 78.0 - 100.0 fL   MCH 33.0 26.0 - 34.0 pg   MCHC 34.3 30.0 - 36.0 g/dL   RDW 91.4 78.2 - 95.6 %   Platelets 266 150 - 400 K/uL   Neutrophils Relative % 61 43 - 77 %   Neutro Abs 6.2 1.7 - 7.7 K/uL   Lymphocytes Relative 26 12 - 46 %   Lymphs Abs 2.7 0.7 - 4.0 K/uL   Monocytes Relative 10 3 - 12 %   Monocytes Absolute 1.1 (H)  0.1 - 1.0 K/uL   Eosinophils Relative 3 0 - 5 %   Eosinophils Absolute 0.3 0.0 - 0.7 K/uL   Basophils Relative 0 0 - 1 %   Basophils Absolute 0.0 0.0 - 0.1 K/uL  HIV antibody   Collection Time: 03/05/15 12:55 AM  Result Value Ref Range   HIV Screen 4th Generation wRfx Non Reactive Non Reactive  ABO/Rh   Collection Time: 03/05/15 12:55 AM  Result Value Ref Range   ABO/RH(D) O POS    US Ob Comp Less 14 Wks  03/05/2015   CLINICAL DATA:  39 year old pregnant female with abdominal pain and bleeding  EXAM: OBSTETRIC <  14 WK US AND TRANSVAGINAL OB US  TECHNIQUE: Both transabdominal and transvaginal ultrasound examinations were performed for complete evaluation of the gestation as well as the maternal uterus, adnexal regions, and pelvic cul-de-sac. Transvaginal technique was performed to assess early pregnancy.  COMPARISON:  None  FINDINGS: Intrauterine gestational sac: Visualized/normal in shape.  Yolk sac:  Seen  Embryo:  Present  Cardiac Activity: Detected  Heart Rate: 140  bpm  CRL:  11   mm   7 w   2 d                  US EDC: 10/20/2015  There is a 1.5 x 0.4 x 1.1 cm subchorionic hemorrhage. Follow-up recommended.  Maternal uterus/adnexae: A corpus luteum is noted in the right ovary. There is a 1.3 x 1.2 x 1.1 cm right paraovarian cyst. The left ovary is unremarkable.  IMPRESSION: Single live intrauterine pregnancy with an estimated gestational age of [redacted] weeks, 2 days. Small subchorionic hemorrhage. Follow-up recommended.   Electronically Signed   By: Elgie CollardArash  Radparvar M.D.   On: 03/05/2015 01:42   [place] Ob Transvaginal  03/05/2015   CLINICAL DATA:  39 year old pregnant female with abdominal pain and bleeding  EXAM: OBSTETRIC <14 WK US AND TRANSVAGINAL OB US  TECHNIQUE: Both transabdominal and transvaginal ultrasound examinations were performed for complete evaluation of the gestation as well as the maternal uterus, adnexal regions, and pelvic cul-de-sac. Transvaginal technique was performed to assess  early pregnancy.  COMPARISON:  None  FINDINGS: Intrauterine gestational sac: Visualized/normal in shape.  Yolk sac:  Seen  Embryo:  Present  Cardiac Activity: Detected  Heart Rate: 140  bpm  CRL:  11   mm   7 w   2 d                  US EDC: 10/20/2015  There is a 1.5 x 0.4 x 1.1 cm subchorionic hemorrhage. Follow-up recommended.  Maternal uterus/adnexae: A corpus luteum is noted in the right ovary. There is a 1.3 x 1.2 x 1.1 cm right paraovarian cyst. The left ovary is unremarkable.  IMPRESSION: Single live intrauterine pregnancy with an estimated gestational age of [redacted] weeks, 2 days. Small subchorionic hemorrhage. Follow-up recommended.   Electronically Signed   By: Elgie CollardArash  Radparvar M.D.   On: 03/05/2015 01:42    MAU Course  Procedures UA, UPT, CBC, ABO Rh, ultrasound, wet prep, gonorrhea/Chlamydia cultures, speculum exam.  MDM Scant amount of vaginal bleeding on exam likely due to small subchorionic hemorrhage. Abdominal pain likely due to right corpus liters cyst. No evidence of ectopic pregnancy. Low suspicion for appendicitis due to location of pain and absence of fever, leukocytosis and GI complaints.    Assessment and Plan   1. Subchorionic hematoma in first trimester, antepartum   2. Vaginal bleeding in pregnancy, first trimester   3. Abdominal pain affecting pregnancy, antepartum   4. Corpus luteum cyst    Discharge home in stable condition. Bleeding precautions. Pelvic rest 1 week. GC/Chlamydia cultures pending. Follow-up Information    [facility] 1 week.   Specialty:  Obstetrics and Gynecology   Why:  Start prenatal care   Contact information:   [address] [place] [phone]      [facility].   Why:  As needed in emergencies   Contact information:   [address] [id number] mc  [place]  [address] [phone]       Medication List    TAKE these medications        Biotin 10 MG Tabs  Take by mouth.     prenatal multivitamin Tabs tablet  Take 1 tablet by mouth daily at 12 noon.       Reganne Messerschmidt 03/05/2015, 1:00 AM

## 2015-03-06 LAB — GC/CHLAMYDIA PROBE AMP (~~LOC~~) NOT AT ARMC
CHLAMYDIA, DNA PROBE: NEGATIVE
Neisseria Gonorrhea: NEGATIVE

## 2015-03-14 LAB — OB RESULTS CONSOLE ANTIBODY SCREEN: ANTIBODY SCREEN: NEGATIVE

## 2015-03-14 LAB — OB RESULTS CONSOLE HEPATITIS B SURFACE ANTIGEN: HEP B S AG: NEGATIVE

## 2015-03-14 LAB — OB RESULTS CONSOLE HIV ANTIBODY (ROUTINE TESTING): HIV: NONREACTIVE

## 2015-03-14 LAB — OB RESULTS CONSOLE RPR: RPR: NONREACTIVE

## 2015-03-14 LAB — OB RESULTS CONSOLE ABO/RH: RH Type: POSITIVE

## 2015-03-14 LAB — OB RESULTS CONSOLE RUBELLA ANTIBODY, IGM: RUBELLA: IMMUNE

## 2015-08-20 NOTE — L&D Delivery Note (Signed)
   Vaginal Delivery Note The pt utilized an epidural as pain management.   Artificial rupture of membranes today, at 1239, clear.  GBS was negative.  Cervical dilation was complete at  1830.  NICHD Category 2.    Pushing with guidance began at  1830.   After 26 minutes of pushing the head, shoulders and the body of a viable female  infant "[name]" delivered spontaneously with maternal effort in the ROA position at 1856.  With vigorous tone and spontaneous cry, the infant was placed on moms abd. After the umbilical cord was clamped it was cut by the FOB, then cord blood was obtained for evaluation.Spontaneous delivery of a intact placenta with a 3 vessel cord via Shultz/Duncan at 1905 .   Episiotomy: None   The vulva, perineum, vaginal vault, rectum and cervix were inspected  and revealed  Superficial bilateral periurethral laceration. The right superficial periurethral was repaired using a 4-0 vicryl on a SH  Needle. The left periurethral was hemostatic and did not require repair.  Lidocaine was not used, the epidural was sufficient for the repair. The rectum sphincter intact after the repair.  Patient tolerated repair well.   Postpartum pitocin as ordered.  Fundus firm, lochia minimum, bleeding under control. EBL 350, Pt hemodynamically stable.   Sponge, laps and needle count correct and verified with the primary care nurse.  Attending MD available at all times.   Routine postpartum orders.  Mother unsure about method of contraception.  Mom plans to breastfeed     Placenta to pathology: NO     Cord Gases sent to lab: NO Cord blood sent to lab: YES   APGARS:  9 at 1 minute and 9 at 5 minutes Weight:. TBD   Both mom and baby were left in stable condition, baby skin to skin.     Stacey [name], CNM, MSN 10/27/2015. 8:12 PM

## 2015-09-19 LAB — OB RESULTS CONSOLE GBS: GBS: NEGATIVE

## 2015-10-16 ENCOUNTER — Encounter (HOSPITAL_COMMUNITY): Payer: Self-pay | Admitting: *Deleted

## 2015-10-16 ENCOUNTER — Inpatient Hospital Stay (HOSPITAL_COMMUNITY)
Admission: AD | Admit: 2015-10-16 | Discharge: 2015-10-16 | Disposition: A | Discharge status: Home / Self Care | Payer: BLUE CROSS/BLUE SHIELD | Source: Ambulatory Visit | Attending: Obstetrics and Gynecology | Admitting: Obstetrics and Gynecology

## 2015-10-16 DIAGNOSIS — Z87891 Personal history of nicotine dependence: Secondary | ICD-10-CM | POA: Diagnosis not present

## 2015-10-16 DIAGNOSIS — R109 Unspecified abdominal pain: Secondary | ICD-10-CM | POA: Diagnosis present

## 2015-10-16 DIAGNOSIS — Z3A39 39 weeks gestation of pregnancy: Secondary | ICD-10-CM | POA: Insufficient documentation

## 2015-10-16 LAB — AMNISURE RUPTURE OF MEMBRANE (ROM) NOT AT ARMC: Amnisure ROM: NEGATIVE

## 2015-10-16 NOTE — MAU Note (Signed)
Urine in lab 

## 2015-10-16 NOTE — MAU Note (Signed)
C/o lower abdominal and vaginal pressure for past 2 weeks; ?SROM this AM around 0500; denies bleeding;

## 2015-10-16 NOTE — Discharge Instructions (Signed)

## 2015-10-16 NOTE — MAU Provider Note (Signed)
History   Rona, Tomson is a 39yp, [id number], 39.2 wks presenting to the MAU announced.  Pt complains of severe pain and lower abdominal pressure that started yesterday, increasing with activity .  Pain was worse yesterday occuring twice a day. Today she is cramping and states she had wet vaginal discharge at 0500 this morning..  Also reports green D/C on Saturday, none since. +FM , no Vb.    There are no active problems to display for this patient.   Chief Complaint  Patient presents with  . Abdominal Pain  . Vaginal Discharge   HPI  OB History    Gravida Para Term Preterm AB TAB SAB Ectopic Multiple Living   Past Medical History  Diagnosis Date  . PONV (postoperative nausea and vomiting)   . Anxiety     Past Surgical History  Procedure Laterality Date  . Appendectomy    . Wisdom tooth extraction    . Leep    . Leep    . Rhinoplasty      Family History  Problem Relation Age of Onset  . Alcohol abuse Neg Hx   . Arthritis Neg Hx   . Asthma Neg Hx   . Birth defects Neg Hx   . Cancer Neg Hx   . COPD Neg Hx   . Depression Neg Hx   . Diabetes Neg Hx   . Drug abuse Neg Hx   . Early death Neg Hx   . Hearing loss Neg Hx   . Heart disease Neg Hx   . Hypertension Neg Hx   . Hyperlipidemia Neg Hx   . Kidney disease Neg Hx   . Learning disabilities Neg Hx   . Mental illness Neg Hx   . Mental retardation Neg Hx   . Miscarriages / Stillbirths Neg Hx   . Stroke Neg Hx   . Vision loss Neg Hx   . Varicose Veins Neg Hx     Social History  Substance Use Topics  . Smoking status: Former Smoker -- 0.25 packs/day  . Smokeless tobacco: None  . Alcohol Use: No    Allergies:  Allergies  Allergen Reactions  . Stadol [Butorphanol] Other (See Comments)    Halucinating     Prescriptions prior to admission  Medication Sig Dispense Refill Last Dose  . Biotin 10 MG TABS Take by mouth.     . Prenatal Vit-Fe Fumarate-FA (PRENATAL MULTIVITAMIN) TABS  tablet Take 1 tablet by mouth daily at 12 noon.   03/03/2015 at Unknown time    ROS Physical Exam   Blood pressure 97/60, pulse 89, temperature 98.7 F (37.1 C), temperature source Oral, resp. rate 18, last menstrual period 01/14/2015.  Filed Vitals:   10/16/15 1047 10/16/15 1209  BP: 97/60 101/59  Pulse: 89 76  Temp: 98.7 F (37.1 C)   TempSrc: Oral   Resp: 18 18      Results for orders placed or performed during the hospital encounter of 10/16/15 (from the past 24 hour(s))  Amnisure rupture of membrane (rom)not at [facility]     Status: None   Collection Time: 10/16/15 11:07 AM  Result Value Ref Range   Amnisure ROM NEGATIVE    FHT: Cat 1 tracing, 145 bpm, moderate variability, +accels, no decels UC;  Uterine irritability, rare contractions  Negative pooling, negative fern  Physical Exam  Constitutional: She is oriented to person, place, and time. She appears  well-developed and well-nourished.  HENT:  Head: Normocephalic.  Eyes: Pupils are equal, round, and reactive to light.  Neck: Normal range of motion.  Cardiovascular: Normal rate and regular rhythm.   Respiratory: Effort normal and breath sounds normal.  GI: Soft.  Genitourinary: Vagina normal. No bleeding in the vagina. No vaginal discharge found.  Musculoskeletal: Normal range of motion.  Neurological: She is alert and oriented to person, place, and time. She has normal reflexes.  Skin: Skin is warm and dry.  Psychiatric: She has a normal mood and affect. Her behavior is normal. Judgment and thought content normal.    ED Course  Assessment: IUP at 39.2 wks Negative pooling Negative Fern Negative Amniosure BH contractions Cat 1 tracing  Plan: DC home Discussed BH vs Labor contractions Call PRN Keep ROB appt.    Alphonzo Severance CNM, MSN 10/16/2015 11:46 AM

## 2015-10-16 NOTE — MAU Note (Signed)
Severe pain and lower abd pressure started yesterday, seemed to follow activty. Was worse yesterday, comes maybe twice a day. Having some cramping.  Had some green d/c on Saturday, none since.  No bleeding.

## 2015-10-26 ENCOUNTER — Other Ambulatory Visit: Payer: Self-pay | Admitting: Obstetrics and Gynecology

## 2015-10-27 ENCOUNTER — Inpatient Hospital Stay (HOSPITAL_COMMUNITY): Payer: BLUE CROSS/BLUE SHIELD | Admitting: Anesthesiology

## 2015-10-27 ENCOUNTER — Encounter (HOSPITAL_COMMUNITY): Payer: Self-pay

## 2015-10-27 ENCOUNTER — Inpatient Hospital Stay (HOSPITAL_COMMUNITY)
Admission: RE | Admit: 2015-10-27 | Discharge: 2015-10-28 | DRG: 775 | Disposition: A | Discharge status: Home / Self Care | Payer: BLUE CROSS/BLUE SHIELD | Source: Ambulatory Visit | Attending: Obstetrics & Gynecology | Admitting: Obstetrics & Gynecology

## 2015-10-27 DIAGNOSIS — O99344 Other mental disorders complicating childbirth: Secondary | ICD-10-CM | POA: Diagnosis present

## 2015-10-27 DIAGNOSIS — Z87891 Personal history of nicotine dependence: Secondary | ICD-10-CM

## 2015-10-27 DIAGNOSIS — O480 Post-term pregnancy: Secondary | ICD-10-CM | POA: Diagnosis present

## 2015-10-27 DIAGNOSIS — F909 Attention-deficit hyperactivity disorder, unspecified type: Secondary | ICD-10-CM | POA: Diagnosis present

## 2015-10-27 DIAGNOSIS — D649 Anemia, unspecified: Secondary | ICD-10-CM | POA: Diagnosis not present

## 2015-10-27 DIAGNOSIS — O9081 Anemia of the puerperium: Secondary | ICD-10-CM | POA: Diagnosis not present

## 2015-10-27 DIAGNOSIS — Z3A40 40 weeks gestation of pregnancy: Secondary | ICD-10-CM

## 2015-10-27 LAB — CBC
HEMATOCRIT: 33.6 % — AB (ref 36.0–46.0)
HEMOGLOBIN: 11.5 g/dL — AB (ref 12.0–15.0)
MCH: 32.5 pg (ref 26.0–34.0)
MCHC: 34.2 g/dL (ref 30.0–36.0)
MCV: 94.9 fL (ref 78.0–100.0)
Platelets: 205 10*3/uL (ref 150–400)
RBC: 3.54 MIL/uL — ABNORMAL LOW (ref 3.87–5.11)
RDW: 14.8 % (ref 11.5–15.5)
WBC: 12.3 10*3/uL — ABNORMAL HIGH (ref 4.0–10.5)

## 2015-10-27 LAB — TYPE AND SCREEN
ABO/RH(D): O POS
Antibody Screen: NEGATIVE

## 2015-10-27 LAB — RPR: RPR Ser Ql: NONREACTIVE

## 2015-10-27 MED ORDER — DIBUCAINE 1 % RE OINT: 1.0000 "application " | TOPICAL_OINTMENT | RECTAL | Status: DC | PRN

## 2015-10-27 MED ORDER — PHENYLEPHRINE 40 MCG/ML (10ML) SYRINGE FOR IV PUSH (FOR BLOOD PRESSURE SUPPORT)
80.0000 ug | PREFILLED_SYRINGE | INTRAVENOUS | Status: DC | PRN
  Filled 2015-10-27: qty 2
  Filled 2015-10-27: qty 20

## 2015-10-27 MED ORDER — DIPHENHYDRAMINE HCL 25 MG PO CAPS: 25.0000 mg | ORAL_CAPSULE | Freq: Four times a day (QID) | ORAL | Status: DC | PRN

## 2015-10-27 MED ORDER — EPHEDRINE 5 MG/ML INJ
10.0000 mg | INTRAVENOUS | Status: DC | PRN
  Filled 2015-10-27: qty 2

## 2015-10-27 MED ORDER — LIDOCAINE HCL (PF) 1 % IJ SOLN
30.0000 mL | INTRAMUSCULAR | Status: DC | PRN
  Filled 2015-10-27: qty 30

## 2015-10-27 MED ORDER — ONDANSETRON HCL 4 MG/2ML IJ SOLN
4.0000 mg | Freq: Four times a day (QID) | INTRAMUSCULAR | Status: DC | PRN
  Administered 2015-10-27: 4 mg via INTRAVENOUS
  Filled 2015-10-27: qty 2

## 2015-10-27 MED ORDER — LACTATED RINGERS IV SOLN
1.0000 m[IU]/min | INTRAVENOUS | Status: DC
  Administered 2015-10-27: 2 m[IU]/min via INTRAVENOUS

## 2015-10-27 MED ORDER — ONDANSETRON HCL 4 MG PO TABS: 4.0000 mg | ORAL_TABLET | ORAL | Status: DC | PRN

## 2015-10-27 MED ORDER — DIPHENHYDRAMINE HCL 50 MG/ML IJ SOLN: 12.5000 mg | INTRAMUSCULAR | Status: DC | PRN

## 2015-10-27 MED ORDER — OXYTOCIN 10 UNIT/ML IJ SOLN
2.5000 [IU]/h | INTRAVENOUS | Status: DC
  Filled 2015-10-27: qty 10

## 2015-10-27 MED ORDER — LACTATED RINGERS IV SOLN
INTRAVENOUS | Status: DC
  Administered 2015-10-27: 02:00:00 via INTRAVENOUS

## 2015-10-27 MED ORDER — PRENATAL MULTIVITAMIN CH
1.0000 | ORAL_TABLET | Freq: Every day | ORAL | Status: DC
  Administered 2015-10-28: 1 via ORAL
  Filled 2015-10-27: qty 1

## 2015-10-27 MED ORDER — SIMETHICONE 80 MG PO CHEW: 80.0000 mg | CHEWABLE_TABLET | ORAL | Status: DC | PRN

## 2015-10-27 MED ORDER — FENTANYL CITRATE (PF) 100 MCG/2ML IJ SOLN: 50.0000 ug | INTRAMUSCULAR | Status: DC | PRN

## 2015-10-27 MED ORDER — ZOLPIDEM TARTRATE 5 MG PO TABS: 5.0000 mg | ORAL_TABLET | Freq: Every evening | ORAL | Status: DC | PRN

## 2015-10-27 MED ORDER — PHENYLEPHRINE 40 MCG/ML (10ML) SYRINGE FOR IV PUSH (FOR BLOOD PRESSURE SUPPORT)
80.0000 ug | PREFILLED_SYRINGE | INTRAVENOUS | Status: DC | PRN
  Administered 2015-10-27: 40 ug via INTRAVENOUS
  Filled 2015-10-27: qty 2

## 2015-10-27 MED ORDER — OXYTOCIN BOLUS FROM INFUSION
500.0000 mL | INTRAVENOUS | Status: DC
  Administered 2015-10-27: 500 mL via INTRAVENOUS

## 2015-10-27 MED ORDER — CITRIC ACID-SODIUM CITRATE 334-500 MG/5ML PO SOLN: 30.0000 mL | ORAL | Status: DC | PRN

## 2015-10-27 MED ORDER — WITCH HAZEL-GLYCERIN EX PADS: 1.0000 "application " | MEDICATED_PAD | CUTANEOUS | Status: DC | PRN

## 2015-10-27 MED ORDER — BENZOCAINE-MENTHOL 20-0.5 % EX AERO
1.0000 "application " | INHALATION_SPRAY | CUTANEOUS | Status: DC | PRN
  Administered 2015-10-27: 1 via TOPICAL
  Filled 2015-10-27: qty 56

## 2015-10-27 MED ORDER — TERBUTALINE SULFATE 1 MG/ML IJ SOLN
0.2500 mg | Freq: Once | INTRAMUSCULAR | Status: DC | PRN
  Filled 2015-10-27: qty 1

## 2015-10-27 MED ORDER — LANOLIN HYDROUS EX OINT: TOPICAL_OINTMENT | CUTANEOUS | Status: DC | PRN

## 2015-10-27 MED ORDER — LIDOCAINE HCL (PF) 1 % IJ SOLN
INTRAMUSCULAR | Status: DC | PRN
  Administered 2015-10-27 (×2): 4 mL via EPIDURAL

## 2015-10-27 MED ORDER — ACETAMINOPHEN 325 MG PO TABS: 650.0000 mg | ORAL_TABLET | ORAL | Status: DC | PRN

## 2015-10-27 MED ORDER — ONDANSETRON HCL 4 MG/2ML IJ SOLN: 4.0000 mg | INTRAMUSCULAR | Status: DC | PRN

## 2015-10-27 MED ORDER — FENTANYL 2.5 MCG/ML BUPIVACAINE 1/10 % EPIDURAL INFUSION (WH - ANES)
14.0000 mL/h | INTRAMUSCULAR | Status: DC | PRN
  Administered 2015-10-27: 14 mL/h via EPIDURAL
  Filled 2015-10-27: qty 125

## 2015-10-27 MED ORDER — IBUPROFEN 600 MG PO TABS
600.0000 mg | ORAL_TABLET | Freq: Four times a day (QID) | ORAL | Status: DC
  Administered 2015-10-27 – 2015-10-28 (×4): 600 mg via ORAL
  Filled 2015-10-27 (×4): qty 1

## 2015-10-27 MED ORDER — LACTATED RINGERS IV SOLN: 500.0000 mL | Freq: Once | INTRAVENOUS | Status: DC

## 2015-10-27 MED ORDER — SENNOSIDES-DOCUSATE SODIUM 8.6-50 MG PO TABS: 2.0000 | ORAL_TABLET | ORAL | Status: DC

## 2015-10-27 MED ORDER — TETANUS-DIPHTH-ACELL PERTUSSIS 5-2.5-18.5 LF-MCG/0.5 IM SUSP: 0.5000 mL | Freq: Once | INTRAMUSCULAR | Status: DC

## 2015-10-27 MED ORDER — LACTATED RINGERS IV SOLN: 500.0000 mL | INTRAVENOUS | Status: DC | PRN

## 2015-10-27 NOTE — Consults (Signed)
  Anesthesia Pain Consult Note  Patient: Stacey [name], 40 y.o., female  Consult Requested by: Osborn CohoAngela Roberts, MD  Reason for Consult: Anesthesia pain consult  Pt interviewed and states her plan for labor is to receive an epidural when she can. States her pain level a 2 now and that her acceptable pain score would be a 5. States her previous epidural allowed her to feel almost no pain.     Stacey [name],Stacey [name] 10/27/2015

## 2015-10-27 NOTE — Progress Notes (Signed)
Subjective: In room to greet patient and husband.  Sitting in high fowlers position smiling and pleasant. States she is doing well.  She mentioned she was recently napping and when she woke up she didn't feel her contractions but when the RN tugged on the foley bulb the contractions started again.   Objective: BP 108/63 mmHg  Pulse 69  Temp(Src) 97.9 F (36.6 C) (Oral)  Resp 18  Ht 5\' 5"  (1.651 m)  Wt 75.751 kg (167 lb)  BMI 27.79 kg/m2  LMP 01/14/2015 (Exact Date)      FHT: Category 1, 125 bpm, moderate variability, +accels UC:   regular, every 1-4 minutes, palpate mild SVE:   Dilation: 1.5 Effacement (%): 70 Station: -2 Exam by:: Stacey [name], CNM  Induction:  Foley bulb placed at 0516 Pitocin at 6 mu/min   Membranes intact Pain management: none now,  desires epidural as labor progresses  Assessment:  IUP at 40.6 IOL for post dates Foley Bulb present Pitocin  IOL GBS negative Cat 1 FHT  Plan: Patient encouraged to rest Continue to apply traction on Foley bulb once per hour until expelled When foley bulb is expelled call CNM Continue other mgmt as ordered   Stacey [name] CNM, [place] 10/27/2015, 8:50 AM

## 2015-10-27 NOTE — Anesthesia Preprocedure Evaluation (Signed)
Anesthesia Evaluation  Patient identified by MRN, date of birth, ID band Patient awake    Reviewed: Allergy & Precautions, H&P , Patient's Chart, lab work & pertinent test results  Airway Mallampati: II  TM Distance: >3 FB Neck ROM: full    Dental no notable dental hx. (+) Teeth Intact   Pulmonary neg pulmonary ROS, former smoker,    Pulmonary exam normal breath sounds clear to auscultation       Cardiovascular negative cardio ROS Normal cardiovascular exam Rhythm:regular Rate:Normal     Neuro/Psych negative neurological ROS  negative psych ROS   GI/Hepatic negative GI ROS, Neg liver ROS,   Endo/Other  negative endocrine ROS  Renal/GU negative Renal ROS  negative genitourinary   Musculoskeletal   Abdominal   Peds  Hematology negative hematology ROS (+)   Anesthesia Other Findings   Reproductive/Obstetrics (+) Pregnancy                             Anesthesia Physical Anesthesia Plan  ASA: II  Anesthesia Plan: Epidural   Post-op Pain Management:    Induction:   Airway Management Planned:   Additional Equipment:   Intra-op Plan:   Post-operative Plan:   Informed Consent: I have reviewed the patients History and Physical, chart, labs and discussed the procedure including the risks, benefits and alternatives for the proposed anesthesia with the patient or authorized representative who has indicated his/her understanding and acceptance.     Plan Discussed with: Anesthesiologist  Anesthesia Plan Comments:         Anesthesia Quick Evaluation  

## 2015-10-27 NOTE — Progress Notes (Signed)
Subjective: Pt has her ring on and is anxious about not being able to take it off.  Currently able to move it around.  Is trying vaseline, tape and elevation finger to reduce swelling.  Minimal swelling noted, finger not red.   Pt also anxious about the induction process.  Reassurance and comfort provided by CNM and family at bedside.   Objective: BP 104/65 mmHg  Pulse 70  Temp(Src) 97.9 F (36.6 C) (Oral)  Resp 18  Ht 5\' 5"  (1.651 m)  Wt 75.751 kg (167 lb)  BMI 27.79 kg/m2  LMP 01/14/2015 (Exact Date)      FHT: Category 1, 125 bpm, moderate variability, +accels  UC:   regular, every 6 minutes SVE:   Dilation: 1.5 Effacement (%): 70 Station: -2 Exam by:: [name]. [name], CNM  Induction:  Foley bulb placed at 0516 Pitocin at 8 mu/min   Membranes intact Pain management: none now, desires epidural as labor progresses   Assessment:  IUP at 40.6 IOL for post dates Foley Bulb present Pitocin IOL GBS negative Cat 1 FHT  Plan: Patient encouraged to rest Continue to apply traction on Foley bulb once per hour until expelled When foley bulb is expelled call CNM Continue other mgmt as ordered  Alphonzo Severanceachel Hibah Odonnell CNM, [place] 10/27/2015, 11:36 AM

## 2015-10-27 NOTE — Progress Notes (Signed)
Subjective: Comfortable S/p epidural. Family at bedside for support.   Objective: BP 108/64 mmHg  Pulse 71  Temp(Src) 98.2 F (36.8 C) (Oral)  Resp 20  Ht 5\' 5"  (1.651 m)  Wt 75.751 kg (167 lb)  BMI 27.79 kg/m2  LMP 01/14/2015 (Exact Date)      FHT: Category 1, 125 bpm, moderate variability, early decelerations UC:   regular, every 4 minutes SVE:   Dilation: 7 Effacement (%): 80, 90 Station: 0 Exam by:: Foye ClockS. Oklesh RN  Induction:  Foley bulb placed at 0516, expelled at 1230 Pitocin at 8 mu/min   Membranes: AROM at 1239, clear Pain management: epidural   Assessment:  IUP at 40.6 IOL for post dates Pitocin IOL Active labor GBS negative Cat 1 FHT  Plan: Continue mgmt as ordered   Alphonzo Severanceachel Silvia Hightower CNM, [place] 10/27/2015, 3:24 PM

## 2015-10-27 NOTE — H&P (Signed)
Stacey [name] is a 40 y.o. female, [id number] at 40.6 weeks, presenting for IOL for postdates pregnancy.  Patient pregnancy history significant for resolved placenta previa, short cervix, and vaginal bleeding [facility].  Patient personal history significant for AMA and ADHD.  Patient is GBS negative and does desire an epidural.    Patient Active Problem List   Diagnosis Date Noted  . Post-dates pregnancy 10/27/2015    History of present pregnancy: Patient entered care at 8.4 weeks.   EDC of 10/20/2015 was established by Definite LMP of 01/14/2015 and consistent with 7.2wk US on 03/04/2015.   Anatomy scan:  19.5 weeks, with normal findings and an placenta.   Additional US evaluations:  Anatomy:  Cvx 2.7cm. Recheck cervix in 4wks by u/s. Anatomy scan - no anomalies and complete. 23.5wks: U/S reviewed: cervical length 2.97cm. AP pocket: 4.24cm. New finding of placenta previa  32.5wks: Ultrasound today: Single gestation, normal fluid, vertex, cervix equals 3.99 cm, growth equals 76 percentile, no placenta previa Significant prenatal events: 1st Trimester: C/o severe constipation. Evaluated for vaginal bleeding and [facility]  2nd Trimester: C/o severe indigestion.  Normal Panorama.  C/O pelvic pressure and soreness with increased vaginal discharge.  AFP Neg.  Patient c/o round ligament pain. Mole removal from right breast resulted in staph infection.   3rd Trimester:   Continued c/o heartburn.  C/o bump/blister on labia-HSV culture negative. Patient c/o intermittent contractions. Patient with cold and given tussionex and mucinex.  Expressed concern regarding fetal hiccups. Continued c/o wet vaginal discharge-leukorrhea.   Last evaluation:  10/23/2015 by Dr. Sallyanne HaversEK in office.  FHR 152, Ve 2/60/-3, BP 110/72, Wt 169lbs TWG 43lbs  OB History    Gravida Para Term Preterm AB TAB SAB Ectopic Multiple Living   4 2   1 1    2     03/2002: Female 7lb 14oz, SVD at 41wks 03/2005: Female 6lbs 11oz, SVD at  40wks 08/2008: SAB  Current Pregnancy with different FOB  Past Medical History  Diagnosis Date  . PONV (postoperative nausea and vomiting)   . Anxiety    Past Surgical History  Procedure Laterality Date  . Appendectomy    . Wisdom tooth extraction    . Leep    . Leep    . Rhinoplasty     Family History: family history is negative for Alcohol abuse, Arthritis, Asthma, Birth defects, Cancer, COPD, Depression, Diabetes, Drug abuse, Early death, Hearing loss, Heart disease, Hypertension, Hyperlipidemia, Kidney disease, Learning disabilities, Mental illness, Mental retardation, Miscarriages / Stillbirths, Stroke, Vision loss, and Varicose Veins. Social History:  reports that she has quit smoking. She does not have any smokeless tobacco history on file. She reports that she does not drink alcohol or use illicit drugs.  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  She has 4 year college education and is of the [place] religion.  Earney MalletCameron Spainhour is husband and FOB who is present and involved.  Prenatal Transfer Tool  Maternal Diabetes: No Genetic Screening: Normal Maternal Ultrasounds/Referrals: Abnormal:  Findings:   Other: resolved Previa and Stable Short Cervix Fetal Ultrasounds or other Referrals:  None Maternal Substance Abuse:  No Significant Maternal Medications:  Meds include: Zantac Protonix Significant Maternal Lab Results: Lab values include: Group B Strep negative    ROS:  +ctx, +lof, -vb, -lof No recent illness or N/V Patient denies headache, visual disturbances, SOB, and epigstric pain  Allergies  Allergen Reactions  . Stadol [Butorphanol] Other (See Comments)  Halucinating        Last menstrual period 01/14/2015.  Physical Exam  Vitals reviewed. Constitutional: She is oriented to person, place, and time. She appears well-developed and well-nourished. No distress.  HENT:  Head: Normocephalic and atraumatic.  Eyes: Conjunctivae are normal.   Neck: Normal range of motion.  Cardiovascular: Normal rate, regular rhythm and normal heart sounds.   Respiratory: Effort normal and breath sounds normal.  GI: Soft. Bowel sounds are normal.  Gravid--fundal height appears AGA, Soft, NT  Musculoskeletal: Normal range of motion. She exhibits edema (Mild Pitting in BLE).  Neurological: She is alert and oriented to person, place, and time.  Skin: Skin is warm and dry.    Leopolds: EFW: 7 1/2-3/4lbs Presentation: Vertex  FHR: 125 bpm, Mod Var, -Decels, +Accels UCs:  None graphed  Prenatal labs: ABO, Rh: --/--/O POS (07/17 0055) Antibody: NEG (03/10 0210) Rubella:  Immune RPR: Nonreactive (07/26 0000)  HBsAg: Negative (07/26 0000)  HIV: Non Reactive (07/17 0055)  GBS: Negative (01/31 0000) Sickle cell/Hgb electrophoresis:  N/A Pap:  Normal 10/2014 GC:  Negative Chlamydia:  Negative Other:  Amnisure-negative    Assessment IUP at 40.6wks Cat I FT Post Dates Pregnancy GBS Negative AMA  Plan: Admit to [facility] per consult with Dr. Sallyanne Havers prior to scheduling of induction Routine Labor and Delivery Orders per [facility] Protocol In room to complete assessment and discuss POC: Will start induction with pitocin as last exam, per EK and RS, was  2cm/70-80/-3 Discussed h/o vaginal deliveries makes for favorable cervix Encouraged rest Routine Induction/Augmentation Orders as appropriate Dr.AR to be updated as appropriate  Joellyn Quails, MSN 10/27/2015, 1:26 AM

## 2015-10-27 NOTE — Progress Notes (Addendum)
Stacey [name] MRN: [id number]  Subjective: -Patient resting in bed.  Reports getting some sleep despite contractions.  Reports back pain, but states this was present prior to indx process start.   Objective: BP 121/75 mmHg  Pulse 70  Temp(Src) 97.9 F (36.6 C) (Oral)  Resp 18  Ht 5\' 5"  (1.651 m)  Wt 75.751 kg (167 lb)  BMI 27.79 kg/m2  LMP 01/14/2015 (Exact Date)      Fetal Monitoring: FHT: 115 bpm, Mod Var, -Decels, +Accels UC: Q3-744min, palpates mild    Vaginal Exam: SVE:   Dilation: 1.5 Effacement (%): 70 Station: -2 Exam by:: [name]. Maryellen Dowdle, CNM Membranes:Intact Internal Monitors: None  Augmentation/Induction: Pitocin:276mUn/min  Cytotec: None Foley Bulb placed without difficulty-Infused with 80mL  Assessment:  IUP at 40.6wks Cat I FT  Pitocin IOL Bishop Score: 8  Plan: -Discussed procedure, r/b, and process of foley bulb catheter including placement, increased risk for infection, pain, bleeding, and spontaneous vs manual removal -Q/C addressed -Foley placed without difficulty -Patient encouraged to rest -Hold pitocin at 556mUn/min until 0700 and then increase as appropriate -Continue other mgmt as ordered   Valma CavaJessica L Zeth Buday,MSN, CNM 10/27/2015, 5:16 AM

## 2015-10-27 NOTE — Progress Notes (Signed)
Subjective: Called by RN, foley bulb has been expelled.  Doing well.  Coping with contractions.   Objective: BP 90/62 mmHg  Pulse 83  Temp(Src) 98.2 F (36.8 C) (Oral)  Resp 18  Ht 5\' 5"  (1.651 m)  Wt 75.751 kg (167 lb)  BMI 27.79 kg/m2  LMP 01/14/2015 (Exact Date)      FHT: Category 1, 125 bpm, moderate variability, +accels, no decels UC:   regular, every 3-6 minutes SVE:   Dilation: 4 Effacement (%): 60 Station: -2 Exam by:: R Arth Nicastro CNM  Induction:  Foley bulb placed at 0516, expelled at 1230 Pitocin at 8 mu/min   Membranes: AROM without difficulty at  1239 Pain management: none now, desires epidural as labor progresses  Assessment:  IUP at 40.6 IOL for post dates AROM Pitocin  GBS negative Cat 1 FHT  Plan:  Discussed R/B of Artificial Rupture of Membranes during Induction of labor - pt agreeable to proceed  Increase Pitocin per protocol  May have Iv pain medicine/Epidural PRN Continue other mgmt as ordered  Alphonzo Severanceachel Tirth Cothron CNM, MSN 10/27/2015, 12:51 PM

## 2015-10-27 NOTE — Anesthesia Procedure Notes (Signed)
Epidural Patient location during procedure: OB Start time: 10/27/2015 2:15 PM  Staffing Anesthesiologist: Mal AmabileFOSTER, Katieann Hungate Performed by: anesthesiologist   Preanesthetic Checklist Completed: patient identified, site marked, surgical consent, pre-op evaluation, timeout performed, IV checked, risks and benefits discussed and monitors and equipment checked  Epidural Patient position: sitting Prep: site prepped and draped and DuraPrep Patient monitoring: continuous pulse ox and blood pressure Approach: midline Location: L3-L4 Injection technique: LOR air  Needle:  Needle type: Tuohy  Needle gauge: 17 G Needle length: 9 cm and 9 Needle insertion depth: 5 cm cm Catheter type: closed end flexible Catheter size: 19 Gauge Catheter at skin depth: 10 cm Test dose: negative and Other  Assessment Events: blood not aspirated, injection not painful, no injection resistance, negative IV test and no paresthesia  Additional Notes Patient identified. Risks and benefits discussed including failed block, incomplete  Pain control, post dural puncture headache, nerve damage, paralysis, blood pressure Changes, nausea, vomiting, reactions to medications-both toxic and allergic and post Partum back pain. All questions were answered. Patient expressed understanding and wished to proceed. Sterile technique was used throughout procedure. Epidural site was Dressed with sterile barrier dressing. No paresthesias, signs of intravascular injection Or signs of intrathecal spread were encountered.  Patient was more comfortable after the epidural was dosed. Please see RN's note for documentation of vital signs and FHR which are stable.

## 2015-10-28 DIAGNOSIS — F909 Attention-deficit hyperactivity disorder, unspecified type: Secondary | ICD-10-CM | POA: Diagnosis present

## 2015-10-28 LAB — CBC
HCT: 32.2 % — ABNORMAL LOW (ref 36.0–46.0)
Hemoglobin: 10.7 g/dL — ABNORMAL LOW (ref 12.0–15.0)
MCH: 31.9 pg (ref 26.0–34.0)
MCHC: 33.2 g/dL (ref 30.0–36.0)
MCV: 96.1 fL (ref 78.0–100.0)
PLATELETS: 201 10*3/uL (ref 150–400)
RBC: 3.35 MIL/uL — ABNORMAL LOW (ref 3.87–5.11)
RDW: 14.9 % (ref 11.5–15.5)
WBC: 15.7 10*3/uL — ABNORMAL HIGH (ref 4.0–10.5)

## 2015-10-28 MED ORDER — MEDROXYPROGESTERONE ACETATE 150 MG/ML IM SUSP
150.0000 mg | Freq: Once | INTRAMUSCULAR | Status: AC
  Administered 2015-10-28: 150 mg via INTRAMUSCULAR
  Filled 2015-10-28: qty 1

## 2015-10-28 MED ORDER — IBUPROFEN 600 MG PO TABS: 600.0000 mg | ORAL_TABLET | Freq: Four times a day (QID) | ORAL | Status: DC | PRN

## 2015-10-28 NOTE — Progress Notes (Signed)
MOB was referred for history of depression/anxiety.  Referral is screened out by Clinical Social Worker because none of the following criteria appear to apply: -History of anxiety/depression during this pregnancy, or of post-partum depression. - Diagnosis of anxiety and/or depression within last 3 years or -MOB's symptoms are currently being treated with medication and/or therapy.  Per chart review, she presents with a history of anxiety and depression since prior to 2010.  Anxiety and depression are no longer identified as a current problem in her prenatal records, no mental health concerns documented during prenatal visits.  Please contact the Clinical Social Worker if needs arise or upon MOB request.   Mayank Teuscher MSW, LCSW [phone] 

## 2015-10-28 NOTE — Lactation Note (Signed)
This note was copied from a baby's chart. Lactation Consultation Note  Possible early discharge. Mom taught hand expression with colostrum visible.  Awaiting first void.  Mother reports that baby is latching well but that she has a history of mastitis. Assisted her with latching baby and demonstrated chin tug to aid in a wider gape. Mom reported this increased her comfort.  Reviewed engorgement and SN prevention. Answered other questions mom had.  Informed of support groups and outpatient services.  Patient Name: Stacey Jamaiyah MitchelArt Buffl Today's Date: 10/28/2015 Reason for consult: Initial assessment   Maternal Data Has patient been taught Hand Expression?: Yes  Feeding Feeding Type: Breast Fed Length of feed: 15 min  LATCH Score/Interventions Latch: Repeated attempts needed to sustain latch, nipple held in mouth throughout feeding, stimulation needed to elicit sucking reflex.  Audible Swallowing: A few with stimulation Intervention(s): Skin to skin;Hand expression Intervention(s): Alternate breast massage  Type of Nipple: Everted at rest and after stimulation  Comfort (Breast/Nipple): Soft / non-tender     Hold (Positioning): No assistance needed to correctly position infant at breast. Intervention(s): Breastfeeding basics reviewed;Skin to skin  LATCH Score: 8  Lactation Tools Discussed/Used     Consult Status      Soyla DryerJoseph, Nevah Dalal 10/28/2015, 4:18 PM

## 2015-10-28 NOTE — Discharge Summary (Signed)
OB Discharge Summary     Patient Name: Stacey [name] DOB: 08/12/1976 MRN: [id number]  Date of admission: 10/27/2015 Delivering MD: Alphonzo SeveranceSTALL, Tonantzin Mimnaugh   Date of discharge: 10/28/2015  Admitting diagnosis: INDUCTION Intrauterine pregnancy: [id number]     Secondary diagnosis:  Principal Problem:   Vaginal delivery Active Problems:   ADHD (attention deficit hyperactivity disorder)  Additional problems: none     Discharge diagnosis: Term Pregnancy Delivered and Anemia                                                                                                Post partum procedures:none  Augmentation: Pitocin and Cytotec  Complications: None  Hospital course:  Induction of Labor With Vaginal Delivery   40 y.o. yo [phone] at 2437w6d was admitted to the hospital 10/27/2015 for induction of labor.  Indication for induction: Postdates.  Patient had an uncomplicated labor course as follows: Membrane Rupture Time/Date: 12:39 PM ,10/27/2015   Intrapartum Procedures: Episiotomy: None [1]                                         Lacerations:  Periurethral [8]  Patient had delivery of a Viable infant.  Information for the patient's newborn:  Fabiola BackerMitchell, Girl Nicola [[id number]][[id number]]  Delivery Method: Vag-Spont   10/27/2015  Details of delivery can be found in separate delivery note.  Patient had a routine postpartum course. Patient is discharged home 10/28/2015.   Physical exam  Filed Vitals:   10/27/15 2131 10/27/15 2235 10/28/15 0230 10/28/15 0545  BP: 102/56 99/42 93/64  96/62  Pulse: 76 84 68 72  Temp: 98.5 F (36.9 C) 98.6 F (37 C) 98 F (36.7 C) 98.1 F (36.7 C)  TempSrc:      Resp: 20 18 18 20   Height:      Weight:       General: alert and cooperative Lochia: appropriate Uterine Fundus: firm Laceration: Healing well DVT Evaluation: No evidence of DVT seen on physical exam. Labs: Lab Results  Component Value Date   WBC 15.7* 10/28/2015   HGB 10.7* 10/28/2015   HCT 32.2* 10/28/2015   MCV 96.1 10/28/2015   PLT 201 10/28/2015   CMP Latest Ref Rng 05/10/2009  Glucose 70 - 99 mg/dL 97  BUN 6 - 23 mg/dL 14  Creatinine 0.4 - 1.2 mg/dL 9.520.83  Sodium 841135 - 324145 mEq/L 139  Potassium 3.5 - 5.1 mEq/L 3.7  Chloride 96 - 112 mEq/L 103  CO2 19 - 32 mEq/L 27  Calcium 8.4 - 10.5 mg/dL 9.5  Total Protein 6.0 - 8.3 g/dL 7.5  Total Bilirubin 0.3 - 1.2 mg/dL 4.0(N1.3(H)  Alkaline Phos 39 - 117 U/L 40  AST 0 - 37 U/L 16  ALT 0 - 35 U/L 14    Discharge instruction: per After Visit Summary and "Baby and Me Booklet". Postpartum instructions after vaginal delivery, breastfeeding , [employer], [employer] and Depoproverat injection instructions provided.   After visit meds:    Medication List  ASK your doctor about these medications        albuterol 108 (90 Base) MCG/ACT inhaler  Commonly known as:  PROVENTIL HFA;VENTOLIN HFA  Inhale 1-2 puffs into the lungs every 6 (six) hours as needed for wheezing or shortness of breath.     budesonide-formoterol 80-4.5 MCG/ACT inhaler  Commonly known as:  SYMBICORT  Inhale 2 puffs into the lungs 2 (two) times daily as needed (for sob).     calcium carbonate 500 MG chewable tablet  Commonly known as:  TUMS - dosed in mg elemental calcium  Chew 2 tablets by mouth daily as needed for indigestion or heartburn.     pantoprazole 20 MG tablet  Commonly known as:  PROTONIX  Take 20 mg by mouth 2 (two) times daily.     prenatal multivitamin Tabs tablet  Take 1 tablet by mouth daily at 12 noon.        Diet: routine diet  Activity: Advance as tolerated. Pelvic rest for 6 weeks.   Outpatient follow up:6 weeks Follow up Appt:No future appointments. Follow up Visit:No Follow-up on file.  Postpartum contraception: Depo Provera - Administered at hospital  Newborn Data: Live born female  Birth Weight: 8 lb 12.9 oz (3995 g) APGAR: 9, 9  Baby Feeding: Breast Disposition:home with mother   10/28/2015 Alphonzo Severance, CNM

## 2015-10-28 NOTE — Progress Notes (Signed)
Subjective: Postpartum Day 1: Vaginal delivery, Superficial bilateral periurethal laceration Patient up ad lib, reports no syncope or dizziness.  Report pain well managed with ibuprofen Feeding:  Breatfeeding Contraceptive plan:  Depoprovera  Objective: Vital signs in last 24 hours: Temp:  [98 F (36.7 C)-99.3 F (37.4 C)] 98.1 F (36.7 C) (03/11 0545) Pulse Rate:  [68-104] 72 (03/11 0545) Resp:  [16-20] 20 (03/11 0545) BP: (93-122)/(42-70) 96/62 mmHg (03/11 0545)  Physical Exam:  General: alert and cooperative Lochia: appropriate Uterine Fundus: firm Perineum: healing well DVT Evaluation: No evidence of DVT seen on physical exam.   CBC Latest Ref Rng 10/28/2015 10/27/2015 03/05/2015  WBC 4.0 - 10.5 K/uL 15.7(H) 12.3(H) 10.3  Hemoglobin 12.0 - 15.0 g/dL 10.7(L) 11.5(L) 12.1  Hematocrit 36.0 - 46.0 % 32.2(L) 33.6(L) 35.3(L)  Platelets 150 - 400 K/uL 201 205 266     Assessment/Plan: Status post vaginal delivery day 1. Desire to discharge home at 24 hrs if baby is able to DC home [facility]  Stable Continue current care.  Beatrix FettersRachel StallCNM 10/28/2015, 3:23 PM

## 2015-10-28 NOTE — Discharge Instructions (Signed)
Postpartum Care After Vaginal Delivery °After you deliver your newborn (postpartum period), the usual stay in the hospital is 24-72 hours. If there were problems with your labor or delivery, or if you have other medical problems, you might be in the hospital longer.  °While you are in the hospital, you will receive help and instructions on how to care for yourself and your newborn during the postpartum period.  °While you are in the hospital: °· Be sure to tell your nurses if you have pain or discomfort, as well as where you feel the pain and what makes the pain worse. °· If you had an incision made near your vagina (episiotomy) or if you had some tearing during delivery, the nurses may put ice packs on your episiotomy or tear. The ice packs may help to reduce the pain and swelling. °· If you are breastfeeding, you may feel uncomfortable contractions of your uterus for a couple of weeks. This is normal. The contractions help your uterus get back to normal size. °· It is normal to have some bleeding after delivery. °· For the first 1-3 days after delivery, the flow is red and the amount may be similar to a period. °· It is common for the flow to start and stop. °· In the first few days, you may pass some small clots. Let your nurses know if you begin to pass large clots or your flow increases. °· Do not  flush blood clots down the toilet before having the nurse look at them. °· During the next 3-10 days after delivery, your flow should become more watery and pink or brown-tinged in color. °· Ten to fourteen days after delivery, your flow should be a small amount of yellowish-white discharge. °· The amount of your flow will decrease over the first few weeks after delivery. Your flow may stop in 6-8 weeks. Most women have had their flow stop by 12 weeks after delivery. °· You should change your sanitary pads frequently. °· Wash your hands thoroughly with soap and water for at least 20 seconds after changing pads, using  the toilet, or before holding or feeding your newborn. °· You should feel like you need to empty your bladder within the first 6-8 hours after delivery. °· In case you become weak, lightheaded, or faint, call your nurse before you get out of bed for the first time and before you take a shower for the first time. °· Within the first few days after delivery, your breasts may begin to feel tender and full. This is called engorgement. Breast tenderness usually goes away within 48-72 hours after engorgement occurs. You may also notice milk leaking from your breasts. If you are not breastfeeding, do not stimulate your breasts. Breast stimulation can make your breasts produce more milk. °· Spending as much time as possible with your newborn is very important. During this time, you and your newborn can feel close and get to know each other. Having your newborn stay in your room (rooming in) will help to strengthen the bond with your newborn.  It will give you time to get to know your newborn and become comfortable caring for your newborn. °· Your hormones change after delivery. Sometimes the hormone changes can temporarily cause you to feel sad or tearful. These feelings should not last more than a few days. If these feelings last longer than that, you should talk to your caregiver. °· If desired, talk to your caregiver about methods of family planning or contraception. °·   Talk to your caregiver about immunizations. Your caregiver may want you to have the following immunizations before leaving the hospital:  Tetanus, diphtheria, and pertussis (Tdap) or tetanus and diphtheria (Td) immunization. It is very important that you and your family (including grandparents) or others caring for your newborn are up-to-date with the Tdap or Td immunizations. The Tdap or Td immunization can help protect your newborn from getting ill.  Rubella immunization.  Varicella (chickenpox) immunization.  Influenza immunization. You should  receive this annual immunization if you did not receive the immunization during your pregnancy.   This information is not intended to replace advice given to you by your health care provider. Make sure you discuss any questions you have with your health care provider.   Document Released: 06/02/2007 Document Revised: 04/29/2012 Document Reviewed: 04/01/2012 Elsevier Interactive Patient Education 2016 [employer]. Iron-Rich Diet Iron is a mineral that helps your body to produce hemoglobin. Hemoglobin is a protein in your red blood cells that carries oxygen to your body's tissues. Eating too little iron may cause you to feel weak and tired, and it can increase your risk for infection. Eating enough iron is necessary for your body's metabolism, muscle function, and nervous system. Iron is naturally found in many foods. It can also be added to foods or fortified in foods. There are two types of dietary iron:  Heme iron. Heme iron is absorbed by the body more easily than nonheme iron. Heme iron is found in meat, poultry, and fish.  Nonheme iron. Nonheme iron is found in dietary supplements, iron-fortified grains, beans, and vegetables. You may need to follow an iron-rich diet if:  You have been diagnosed with iron deficiency or iron-deficiency anemia.  You have a condition that prevents you from absorbing dietary iron, such as:  Infection in your intestines.  Celiac disease. This involves long-lasting (chronic) inflammation of your intestines.  You do not eat enough iron.  You eat a diet that is high in foods that impair iron absorption.  You have lost a lot of blood.  You have heavy bleeding during your menstrual cycle.  You are pregnant. WHAT IS MY PLAN? Your health care provider may help you to determine how much iron you need per day based on your condition. Generally, when a person consumes sufficient amounts of iron in the diet, the following iron needs are met:  Men.  46-59  years old: 11 mg per day.  76-70 years old: 8 mg per day.  Women.   61-50 years old: 15 mg per day.  38-74 years old: 18 mg per day.  Over 26 years old: 8 mg per day.  Pregnant women: 27 mg per day.  Breastfeeding women: 9 mg per day. WHAT DO I NEED TO KNOW ABOUT AN IRON-RICH DIET?  Eat fresh fruits and vegetables that are high in vitamin C along with foods that are high in iron. This will help increase the amount of iron that your body absorbs from food, especially with foods containing nonheme iron. Foods that are high in vitamin C include oranges, peppers, tomatoes, and mango.  Take iron supplements only as directed by your health care provider. Overdose of iron can be life-threatening. If you were prescribed iron supplements, take them with orange juice or a vitamin C supplement.  Cook foods in pots and pans that are made from iron.   Eat nonheme iron-containing foods alongside foods that are high in heme iron. This helps to improve your iron absorption.  Certain foods and drinks contain compounds that impair iron absorption. Avoid eating these foods in the same meal as iron-rich foods or with iron supplements. These include:  Coffee, black tea, and red wine.  Milk, dairy products, and foods that are high in calcium.  Beans, soybeans, and peas.  Whole grains.  When eating foods that contain both nonheme iron and compounds that impair iron absorption, follow these tips to absorb iron better.   Soak beans overnight before cooking.  Soak whole grains overnight and drain them before using.  Ferment flours before baking, such as using yeast in bread dough. WHAT FOODS CAN I EAT? Grains Iron-fortified breakfast cereal. Iron-fortified whole-wheat bread. Enriched rice. Sprouted grains. Vegetables Spinach. Potatoes with skin. Green peas. Broccoli. Red and green bell peppers. Fermented vegetables. Fruits Prunes. Raisins. Oranges. Strawberries. Mango.  Grapefruit. Meats and Other Protein Sources Beef liver. Oysters. Beef. Shrimp. [place]. Chicken. [place]. Sardines. Chickpeas. Nuts. Tofu. Beverages Tomato juice. Fresh orange juice. Prune juice. Hibiscus tea. Fortified instant breakfast shakes. Condiments Tahini. Fermented soy sauce. Sweets and Desserts Black-strap molasses.  Other Wheat germ. The items listed above may not be a complete list of recommended foods or beverages. Contact your dietitian for more options. WHAT FOODS ARE NOT RECOMMENDED? Grains Whole grains. Bran cereal. Bran flour. Oats. Vegetables Artichokes. Brussels sprouts. Kale. Fruits Blueberries. Raspberries. Strawberries. Figs. Meats and Other Protein Sources Soybeans. Products made from soy protein. Dairy Milk. Cream. Cheese. Yogurt. Cottage cheese. Beverages Coffee. Black tea. Red wine. Sweets and Desserts Cocoa. Chocolate. Ice cream. Other Basil. Oregano. Parsley. The items listed above may not be a complete list of foods and beverages to avoid. Contact your dietitian for more information.   This information is not intended to replace advice given to you by your health care provider. Make sure you discuss any questions you have with your health care provider.   Document Released: 03/19/2005 Document Revised: 08/26/2014 Document Reviewed: 03/02/2014 Elsevier Interactive Patient Education 2016 [employer]. Postpartum Depression and Baby Blues The postpartum period begins right after the birth of a baby. During this time, there is often a great amount of joy and excitement. It is also a time of many changes in the life of the parents. Regardless of how many times a mother gives birth, each child brings new challenges and dynamics to the family. It is not unusual to have feelings of excitement along with confusing shifts in moods, emotions, and thoughts. All mothers are at risk of developing postpartum depression or the "baby blues." These mood  changes can occur right after giving birth, or they may occur many months after giving birth. The baby blues or postpartum depression can be mild or severe. Additionally, postpartum depression can go away rather quickly, or it can be a long-term condition.  CAUSES Raised hormone levels and the rapid drop in those levels are thought to be a main cause of postpartum depression and the baby blues. A number of hormones change during and after pregnancy. Estrogen and progesterone usually decrease right after the delivery of your baby. The levels of thyroid hormone and various cortisol steroids also rapidly drop. Other factors that play a role in these mood changes include major life events and genetics.  RISK FACTORS If you have any of the following risks for the baby blues or postpartum depression, know what symptoms to watch out for during the postpartum period. Risk factors that may increase the likelihood of getting the baby blues or postpartum depression include:  Having a personal  or family history of depression.   Having depression while being pregnant.   Having premenstrual mood issues or mood issues related to oral contraceptives.  Having a lot of life stress.   Having marital conflict.   Lacking a social support network.   Having a baby with special needs.   Having health problems, such as diabetes.  SIGNS AND SYMPTOMS Symptoms of baby blues include:  Brief changes in mood, such as going from extreme happiness to sadness.  Decreased concentration.   Difficulty sleeping.   Crying spells, tearfulness.   Irritability.   Anxiety.  Symptoms of postpartum depression typically begin within the first month after giving birth. These symptoms include:  Difficulty sleeping or excessive sleepiness.   Marked weight loss.   Agitation.   Feelings of worthlessness.   Lack of interest in activity or food.  Postpartum psychosis is a very serious condition and can be  dangerous. Fortunately, it is rare. Displaying any of the following symptoms is cause for immediate medical attention. Symptoms of postpartum psychosis include:   Hallucinations and delusions.   Bizarre or disorganized behavior.   Confusion or disorientation.  DIAGNOSIS  A diagnosis is made by an evaluation of your symptoms. There are no medical or lab tests that lead to a diagnosis, but there are various questionnaires that a health care provider may use to identify those with the baby blues, postpartum depression, or psychosis. Often, a screening tool called the [place] Postnatal Depression Scale is used to diagnose depression in the postpartum period.  TREATMENT The baby blues usually goes away on its own in 1-2 weeks. Social support is often all that is needed. You will be encouraged to get adequate sleep and rest. Occasionally, you may be given medicines to help you sleep.  Postpartum depression requires treatment because it can last several months or longer if it is not treated. Treatment may include individual or group therapy, medicine, or both to address any social, physiological, and psychological factors that may play a role in the depression. Regular exercise, a healthy diet, rest, and social support may also be strongly recommended.  Postpartum psychosis is more serious and needs treatment right away. Hospitalization is often needed. HOME CARE INSTRUCTIONS  Get as much rest as you can. Nap when the baby sleeps.   Exercise regularly. Some women find yoga and walking to be beneficial.   Eat a balanced and nourishing diet.   Do little things that you enjoy. Have a cup of tea, take a bubble bath, read your favorite magazine, or listen to your favorite music.  Avoid alcohol.   Ask for help with household chores, cooking, grocery shopping, or running errands as needed. Do not try to do everything.   Talk to people close to you about how you are feeling. Get support from  your partner, family members, friends, or other new moms.  Try to stay positive in how you think. Think about the things you are grateful for.   Do not spend a lot of time alone.   Only take over-the-counter or prescription medicine as directed by your health care provider.  Keep all your postpartum appointments.   Let your health care provider know if you have any concerns.  SEEK MEDICAL CARE IF: You are having a reaction to or problems with your medicine. SEEK IMMEDIATE MEDICAL CARE IF:  You have suicidal feelings.   You think you may harm the baby or someone else. MAKE SURE YOU:  Understand these instructions.  Will  watch your condition.  Will get help right away if you are not doing well or get worse.   This information is not intended to replace advice given to you by your health care provider. Make sure you discuss any questions you have with your health care provider.   Document Released: 05/09/2004 Document Revised: 08/10/2013 Document Reviewed: 05/17/2013 Elsevier Interactive Patient Education 2016 [place]. Medroxyprogesterone injection [Contraceptive] What is this medicine? MEDROXYPROGESTERONE (me DROX ee proe JES te rone) contraceptive injections prevent pregnancy. They provide effective birth control for 3 months. Depo-subQ Provera 104 is also used for treating pain related to endometriosis. This medicine may be used for other purposes; ask your health care provider or pharmacist if you have questions. What should I tell my health care provider before I take this medicine? They need to know if you have any of these conditions: -frequently drink alcohol -asthma -blood vessel disease or a history of a blood clot in the lungs or legs -bone disease such as osteoporosis -breast cancer -diabetes -eating disorder (anorexia nervosa or bulimia) -high blood pressure -HIV infection or AIDS -kidney disease -liver disease -mental  depression -migraine -seizures (convulsions) -stroke -tobacco smoker -vaginal bleeding -an unusual or allergic reaction to medroxyprogesterone, other hormones, medicines, foods, dyes, or preservatives -pregnant or trying to get pregnant -breast-feeding How should I use this medicine? Depo-Provera Contraceptive injection is given into a muscle. Depo-subQ Provera 104 injection is given under the skin. These injections are given by a health care professional. You must not be pregnant before getting an injection. The injection is usually given during the first 5 days after the start of a menstrual period or 6 weeks after delivery of a baby. Talk to your pediatrician regarding the use of this medicine in children. Special care may be needed. These injections have been used in female children who have started having menstrual periods. Overdosage: If you think you have taken too much of this medicine contact a poison control center or emergency room at once. NOTE: This medicine is only for you. Do not share this medicine with others. What if I miss a dose? Try not to miss a dose. You must get an injection once every 3 months to maintain birth control. If you cannot keep an appointment, call and reschedule it. If you wait longer than 13 weeks between Depo-Provera contraceptive injections or longer than 14 weeks between Depo-subQ Provera 104 injections, you could get pregnant. Use another method for birth control if you miss your appointment. You may also need a pregnancy test before receiving another injection. What may interact with this medicine? Do not take this medicine with any of the following medications: -bosentan This medicine may also interact with the following medications: -aminoglutethimide -antibiotics or medicines for infections, especially rifampin, rifabutin, rifapentine, and griseofulvin -aprepitant -barbiturate medicines such as phenobarbital or  primidone -bexarotene -carbamazepine -medicines for seizures like ethotoin, felbamate, oxcarbazepine, phenytoin, topiramate -modafinil -St. John's wort This list may not describe all possible interactions. Give your health care provider a list of all the medicines, herbs, non-prescription drugs, or dietary supplements you use. Also tell them if you smoke, drink alcohol, or use illegal drugs. Some items may interact with your medicine. What should I watch for while using this medicine? This drug does not protect you against HIV infection (AIDS) or other sexually transmitted diseases. Use of this product may cause you to lose calcium from your bones. Loss of calcium may cause weak bones (osteoporosis). Only use this product for more  than 2 years if other forms of birth control are not right for you. The longer you use this product for birth control the more likely you will be at risk for weak bones. Ask your health care professional how you can keep strong bones. You may have a change in bleeding pattern or irregular periods. Many females stop having periods while taking this drug. If you have received your injections on time, your chance of being pregnant is very low. If you think you may be pregnant, see your health care professional as soon as possible. Tell your health care professional if you want to get pregnant within the next year. The effect of this medicine may last a long time after you get your last injection. What side effects may I notice from receiving this medicine? Side effects that you should report to your doctor or health care professional as soon as possible: -allergic reactions like skin rash, itching or hives, swelling of the face, lips, or tongue -breast tenderness or discharge -breathing problems -changes in vision -depression -feeling faint or lightheaded, falls -fever -pain in the abdomen, chest, groin, or leg -problems with balance, talking, walking -unusually weak  or tired -yellowing of the eyes or skin Side effects that usually do not require medical attention (report to your doctor or health care professional if they continue or are bothersome): -acne -fluid retention and swelling -headache -irregular periods, spotting, or absent periods -temporary pain, itching, or skin reaction at site where injected -weight gain This list may not describe all possible side effects. Call your doctor for medical advice about side effects. You may report side effects to FDA at 1-800-FDA-1088. Where should I keep my medicine? This does not apply. The injection will be given to you by a health care professional. NOTE: This sheet is a summary. It may not cover all possible information. If you have questions about this medicine, talk to your doctor, pharmacist, or health care provider.    2016, Elsevier/[employer]. (2008-08-26 18:37:56)

## 2015-10-28 NOTE — Anesthesia Postprocedure Evaluation (Signed)
Anesthesia Post Note  Patient: Stacey [name]  Procedure(s) Performed: * No procedures listed *  Patient location during evaluation: Mother Baby Anesthesia Type: Epidural Level of consciousness: awake and alert Pain management: pain level controlled Vital Signs Assessment: post-procedure vital signs reviewed and stable Respiratory status: spontaneous breathing, nonlabored ventilation and respiratory function stable Cardiovascular status: stable Postop Assessment: no headache, no backache and epidural receding Anesthetic complications: no    Last Vitals:  Filed Vitals:   10/28/15 0230 10/28/15 0545  BP: 93/64 96/62  Pulse: 68 72  Temp: 36.7 C 36.7 C  Resp: 18 20    Last Pain:  Filed Vitals:   10/28/15 0817  PainSc: 0-No pain                 Hailee Hollick

## 2016-08-19 NOTE — L&D Delivery Note (Signed)
Delivery Note  Patient pushed for less than 10 minutes after she was noted to be C/C/+2.  At 6:09 PM a viable and healthy female was delivered via Vaginal, Spontaneous Delivery (Presentation: LOA) through a tight nuchal cord x 1.  APGAR 8/9 weight pending.  Baby laid on maternal abdomen and delayed cord clamping done.  Cord clamped and then cut by Dad.  Placenta spontaneously delivered intact, 3 vessels noted.  Uterine atony alleviated by massage and IV pitocin.   There were no lacerations.    Complications.   Anesthesia:  Epidural Episiotomy:  None Lacerations:  none Suture Repair: n/a Est. Blood Loss (mL): 200 mL   Mom to postpartum.  Baby to Couplet care / Skin to Skin.  Stacey [name], Stacey [name] 06/04/2017, 6:24 PM

## 2016-09-06 ENCOUNTER — Emergency Department (HOSPITAL_BASED_OUTPATIENT_CLINIC_OR_DEPARTMENT_OTHER)
Admission: EM | Admit: 2016-09-06 | Discharge: 2016-09-06 | Disposition: A | Discharge status: Home / Self Care | Payer: BLUE CROSS/BLUE SHIELD | Attending: Emergency Medicine | Admitting: Emergency Medicine

## 2016-09-06 ENCOUNTER — Encounter (HOSPITAL_BASED_OUTPATIENT_CLINIC_OR_DEPARTMENT_OTHER): Payer: Self-pay | Admitting: Emergency Medicine

## 2016-09-06 DIAGNOSIS — Z79899 Other long term (current) drug therapy: Secondary | ICD-10-CM | POA: Insufficient documentation

## 2016-09-06 DIAGNOSIS — J45909 Unspecified asthma, uncomplicated: Secondary | ICD-10-CM | POA: Insufficient documentation

## 2016-09-06 DIAGNOSIS — Z87891 Personal history of nicotine dependence: Secondary | ICD-10-CM | POA: Insufficient documentation

## 2016-09-06 DIAGNOSIS — N12 Tubulo-interstitial nephritis, not specified as acute or chronic: Secondary | ICD-10-CM | POA: Insufficient documentation

## 2016-09-06 DIAGNOSIS — F909 Attention-deficit hyperactivity disorder, unspecified type: Secondary | ICD-10-CM | POA: Insufficient documentation

## 2016-09-06 HISTORY — DX: Unspecified asthma, uncomplicated: J45.909

## 2016-09-06 LAB — URINALYSIS, MICROSCOPIC (REFLEX)

## 2016-09-06 LAB — PREGNANCY, URINE: PREG TEST UR: NEGATIVE

## 2016-09-06 LAB — CBC WITH DIFFERENTIAL/PLATELET
BASOS ABS: 0 10*3/uL (ref 0.0–0.1)
BASOS PCT: 0 %
EOS ABS: 0.4 10*3/uL (ref 0.0–0.7)
EOS PCT: 4 %
HEMATOCRIT: 41 % (ref 36.0–46.0)
Hemoglobin: 13.9 g/dL (ref 12.0–15.0)
Lymphocytes Relative: 18 %
Lymphs Abs: 2 10*3/uL (ref 0.7–4.0)
MCH: 32.8 pg (ref 26.0–34.0)
MCHC: 33.9 g/dL (ref 30.0–36.0)
MCV: 96.7 fL (ref 78.0–100.0)
MONO ABS: 0.6 10*3/uL (ref 0.1–1.0)
MONOS PCT: 6 %
NEUTROS ABS: 8.4 10*3/uL — AB (ref 1.7–7.7)
Neutrophils Relative %: 72 %
PLATELETS: 272 10*3/uL (ref 150–400)
RBC: 4.24 MIL/uL (ref 3.87–5.11)
RDW: 12.7 % (ref 11.5–15.5)
WBC: 11.5 10*3/uL — ABNORMAL HIGH (ref 4.0–10.5)

## 2016-09-06 LAB — URINALYSIS, ROUTINE W REFLEX MICROSCOPIC
Bilirubin Urine: NEGATIVE
Glucose, UA: NEGATIVE mg/dL
KETONES UR: NEGATIVE mg/dL
NITRITE: NEGATIVE
PROTEIN: NEGATIVE mg/dL
Specific Gravity, Urine: 1.004 — ABNORMAL LOW (ref 1.005–1.030)
pH: 7.5 (ref 5.0–8.0)

## 2016-09-06 LAB — BASIC METABOLIC PANEL
ANION GAP: 8 (ref 5–15)
BUN: 14 mg/dL (ref 6–20)
CO2: 27 mmol/L (ref 22–32)
CREATININE: 0.76 mg/dL (ref 0.44–1.00)
Calcium: 9.6 mg/dL (ref 8.9–10.3)
Chloride: 102 mmol/L (ref 101–111)
Glucose, Bld: 107 mg/dL — ABNORMAL HIGH (ref 65–99)
Potassium: 3.6 mmol/L (ref 3.5–5.1)
SODIUM: 137 mmol/L (ref 135–145)

## 2016-09-06 MED ORDER — SODIUM CHLORIDE 0.9 % IV BOLUS (SEPSIS)
1000.0000 mL | Freq: Once | INTRAVENOUS | Status: AC
  Administered 2016-09-06: 1000 mL via INTRAVENOUS

## 2016-09-06 MED ORDER — DEXTROSE 5 % IV SOLN
1.0000 g | Freq: Once | INTRAVENOUS | Status: AC
  Administered 2016-09-06: 1 g via INTRAVENOUS
  Filled 2016-09-06: qty 10

## 2016-09-06 MED ORDER — CEPHALEXIN 500 MG PO CAPS: 500.0000 mg | ORAL_CAPSULE | Freq: Three times a day (TID) | ORAL | 0 refills | Status: DC

## 2016-09-06 NOTE — ED Triage Notes (Signed)
Pt having dysuria since Monday.  Pt also having flank pain.  Some lightheadedness.

## 2016-09-06 NOTE — ED Provider Notes (Signed)
[facility] Provider Note   CSN: [id number] Arrival date & time: 09/06/16  1424     History   Chief Complaint Chief Complaint  Patient presents with  . Dysuria  . Back Pain    HPI Stacey [name] is a 41 y.o. female.  HPI 41 year old female who presents with low back pain and dysuria. She is otherwise healthy with history of asthma and appendectomy. Onset of urinary urgency 4 days ago followed by urinary frequency and mild dysuria. Has had one episode of UTI before in the past which she thinks may have felt similar. Over the past few days has had low back pain associated. No nausea or vomiting, diarrhea, after normal vaginal bleeding or discharge. No fevers or chills. No hematuria or history of kidney stones. States that she initially called her PCP, who directed her to urgent care, who subsequently directed her to the ED for evaluation of potential kidney infection. Has not tried medications prior to arrival.    Past Medical History:  Diagnosis Date  . Anxiety   . Asthma   . PONV (postoperative nausea and vomiting)     Patient Active Problem List   Diagnosis Date Noted  . ADHD (attention deficit hyperactivity disorder) 10/28/2015  . Vaginal delivery 10/27/2015    Past Surgical History:  Procedure Laterality Date  . APPENDECTOMY    . LEEP    . LEEP    . RHINOPLASTY    . WISDOM TOOTH EXTRACTION      OB History    Gravida Para Term Preterm AB Living   4 3 3   1 3    SAB TAB Ectopic Multiple Live Births     1   0 3       Home Medications    Prior to Admission medications   Medication Sig Start Date End Date Taking? Authorizing Provider  albuterol (PROVENTIL HFA;VENTOLIN HFA) 108 (90 Base) MCG/ACT inhaler Inhale 1-2 puffs into the lungs every 6 (six) hours as needed for wheezing or shortness of breath.    Historical Provider, MD  budesonide-formoterol (SYMBICORT) 80-4.5 MCG/ACT inhaler Inhale 2 puffs into the lungs 2 (two) times daily as needed (for  sob).    Historical Provider, MD  cephALEXin (KEFLEX) 500 MG capsule Take 1 capsule (500 mg total) by mouth 3 (three) times daily. 09/06/16   Lavera Guise, MD  Prenatal Vit-Fe Fumarate-FA (PRENATAL MULTIVITAMIN) TABS tablet Take 1 tablet by mouth daily at 12 noon.    Historical Provider, MD    Family History Family History  Problem Relation Age of Onset  . Alcohol abuse Neg Hx   . Arthritis Neg Hx   . Asthma Neg Hx   . Birth defects Neg Hx   . Cancer Neg Hx   . COPD Neg Hx   . Depression Neg Hx   . Diabetes Neg Hx   . Drug abuse Neg Hx   . Early death Neg Hx   . Hearing loss Neg Hx   . Heart disease Neg Hx   . Hypertension Neg Hx   . Hyperlipidemia Neg Hx   . Kidney disease Neg Hx   . Learning disabilities Neg Hx   . Mental illness Neg Hx   . Mental retardation Neg Hx   . Miscarriages / Stillbirths Neg Hx   . Stroke Neg Hx   . Vision loss Neg Hx   . Varicose Veins Neg Hx     Social History Social History  Substance Use  Topics  . Smoking status: Former Smoker    Packs/day: 0.25  . Smokeless tobacco: Never Used  . Alcohol use No     Allergies   Stadol [butorphanol]   Review of Systems Review of Systems 10/14 systems reviewed and are negative other than those stated in the HPI   Physical Exam Updated Vital Signs BP 107/76 (BP Location: Right Arm)   Pulse 70   Temp 98.3 F (36.8 C) (Oral)   Resp 18   Ht 5\' 5"  (1.651 m)   Wt 125 lb (56.7 kg)   LMP 08/19/2016   SpO2 100%   BMI 20.80 kg/m   Physical Exam Physical Exam  Nursing note and vitals reviewed. Constitutional: Well developed, well nourished, non-toxic, and in no acute distress Head: Normocephalic and atraumatic.  Mouth/Throat: Oropharynx is clear and moist.  Neck: Normal range of motion. Neck supple.  Cardiovascular: Normal rate and regular rhythm.   Pulmonary/Chest: Effort normal and breath sounds normal.  Abdominal: Soft. There is mild suprapubic tenderness. There is no rebound and no  guarding. no significant CVA tenderness. Musculoskeletal: Normal range of motion.  Neurological: Alert, no facial droop, fluent speech, moves all extremities symmetrically Skin: Skin is warm and dry.  Psychiatric: Cooperative   ED Treatments / Results  Labs (all labs ordered are listed, but only abnormal results are displayed) Labs Reviewed  URINALYSIS, ROUTINE W REFLEX MICROSCOPIC - Abnormal; Notable for the following:       Result Value   APPearance CLOUDY (*)    Specific Gravity, Urine 1.004 (*)    Hgb urine dipstick LARGE (*)    Leukocytes, UA LARGE (*)    All other components within normal limits  URINALYSIS, MICROSCOPIC (REFLEX) - Abnormal; Notable for the following:    Bacteria, UA FEW (*)    Squamous Epithelial / LPF 0-5 (*)    All other components within normal limits  CBC WITH DIFFERENTIAL/PLATELET - Abnormal; Notable for the following:    WBC 11.5 (*)    Neutro Abs 8.4 (*)    All other components within normal limits  BASIC METABOLIC PANEL - Abnormal; Notable for the following:    Glucose, Bld 107 (*)    All other components within normal limits  URINE CULTURE  PREGNANCY, URINE    EKG  EKG Interpretation None       Radiology No results found.  Procedures Procedures (including critical care time)  Medications Ordered in ED Medications  sodium chloride 0.9 % bolus 1,000 mL (1,000 mLs Intravenous New Bag/Given 09/06/16 1742)  cefTRIAXone (ROCEPHIN) 1 g in dextrose 5 % 50 mL IVPB (not administered)     Initial Impression / Assessment and Plan / ED Course  I have reviewed the triage vital signs and the nursing notes.  Pertinent labs & imaging results that were available during my care of the patient were reviewed by me and considered in my medical decision making (see chart for details).     41 year old female who presenting symptoms with urinary complaints and low back pain concerning for pyelonephritis. She is well-appearing and in no acute distress.  Her vital signs are within normal limits. No signs or symptoms of severe systemic illness. Blood work reassuring with minimal leukocytosis. Urine does show evidence of UTI and sent for urine culture. Abdomen benign and no concerns at this time for serious intraabdominal process. No concerns at this time for kidney stone. Given first dose of ceftriaxone, and we'll discharge with 10 day course of 3 times  a day Keflex. Strict return and follow-up instructions reviewed. She expressed understanding of all discharge instructions and felt comfortable with the plan of care.   Final Clinical Impressions(s) / ED Diagnoses   Final diagnoses:  Pyelonephritis    New Prescriptions New Prescriptions   CEPHALEXIN (KEFLEX) 500 MG CAPSULE    Take 1 capsule (500 mg total) by mouth 3 (three) times daily.     Lavera Guise, MD 09/06/16 [phone]

## 2016-09-06 NOTE — Discharge Instructions (Signed)
Please take antibiotics for kidney infection.  Please return without fail for worsening symptoms, including new fevers, escalating pain, intractable vomiting, confusion or any other symptoms concerning to you.

## 2016-09-09 LAB — URINE CULTURE: Culture: >=100000 — AB

## 2016-09-10 ENCOUNTER — Telehealth (HOSPITAL_BASED_OUTPATIENT_CLINIC_OR_DEPARTMENT_OTHER): Payer: Self-pay | Admitting: Emergency Medicine

## 2016-09-10 NOTE — Telephone Encounter (Signed)
Post ED Visit - Positive Culture Follow-up  Culture report reviewed by antimicrobial stewardship pharmacist:  []  Enzo BiNathan Batchelder, Pharm.D. []  Celedonio MiyamotoJeremy Frens, Pharm.D., BCPS []  Garvin FilaMike Maccia, Pharm.D. []  Georgina PillionElizabeth Martin, Pharm.D., BCPS []  Battle GroundMinh Pham, [address]Pharm[address]D., BCPS, AAHIVP []  Estella HuskMichelle Turner, Pharm.D., BCPS, AAHIVP []  Tennis Mustassie Stewart, Pharm.D. []  Rob MarquetteVincent, [address] BoulevardPharm[address] Joe Arminger PharmD  Positive urine culture Treated with cephalexin, organism sensitive to the same and no further patient follow-up is required at this time.  Berle MullMiller, Kathye Cipriani 09/10/2016, 10:33 AM

## 2016-11-01 ENCOUNTER — Other Ambulatory Visit: Payer: Self-pay | Admitting: Obstetrics

## 2016-11-12 LAB — OB RESULTS CONSOLE ABO/RH: RH Type: POSITIVE

## 2016-11-12 LAB — OB RESULTS CONSOLE GC/CHLAMYDIA
CHLAMYDIA, DNA PROBE: NEGATIVE
GC PROBE AMP, GENITAL: NEGATIVE

## 2016-11-12 LAB — OB RESULTS CONSOLE HIV ANTIBODY (ROUTINE TESTING): HIV: NONREACTIVE

## 2016-11-12 LAB — OB RESULTS CONSOLE RPR: RPR: NONREACTIVE

## 2016-11-12 LAB — OB RESULTS CONSOLE HEPATITIS B SURFACE ANTIGEN: Hepatitis B Surface Ag: NEGATIVE

## 2016-11-12 LAB — OB RESULTS CONSOLE ANTIBODY SCREEN: Antibody Screen: NEGATIVE

## 2016-11-12 LAB — OB RESULTS CONSOLE RUBELLA ANTIBODY, IGM: RUBELLA: IMMUNE

## 2016-12-12 ENCOUNTER — Encounter (HOSPITAL_COMMUNITY): Payer: Self-pay | Admitting: *Deleted

## 2016-12-12 ENCOUNTER — Inpatient Hospital Stay (HOSPITAL_COMMUNITY)
Admission: AD | Admit: 2016-12-12 | Discharge: 2016-12-12 | Disposition: A | Discharge status: Home / Self Care | Payer: BLUE CROSS/BLUE SHIELD | Source: Ambulatory Visit | Attending: Obstetrics and Gynecology | Admitting: Obstetrics and Gynecology

## 2016-12-12 DIAGNOSIS — Z3A14 14 weeks gestation of pregnancy: Secondary | ICD-10-CM

## 2016-12-12 DIAGNOSIS — O98512 Other viral diseases complicating pregnancy, second trimester: Secondary | ICD-10-CM | POA: Insufficient documentation

## 2016-12-12 DIAGNOSIS — J45909 Unspecified asthma, uncomplicated: Secondary | ICD-10-CM | POA: Insufficient documentation

## 2016-12-12 DIAGNOSIS — O99342 Other mental disorders complicating pregnancy, second trimester: Secondary | ICD-10-CM | POA: Insufficient documentation

## 2016-12-12 DIAGNOSIS — A084 Viral intestinal infection, unspecified: Secondary | ICD-10-CM | POA: Insufficient documentation

## 2016-12-12 DIAGNOSIS — Z87891 Personal history of nicotine dependence: Secondary | ICD-10-CM | POA: Insufficient documentation

## 2016-12-12 DIAGNOSIS — F419 Anxiety disorder, unspecified: Secondary | ICD-10-CM | POA: Insufficient documentation

## 2016-12-12 DIAGNOSIS — O99512 Diseases of the respiratory system complicating pregnancy, second trimester: Secondary | ICD-10-CM | POA: Insufficient documentation

## 2016-12-12 LAB — COMPREHENSIVE METABOLIC PANEL
ALBUMIN: 3.7 g/dL (ref 3.5–5.0)
ALK PHOS: 48 U/L (ref 38–126)
ALT: 18 U/L (ref 14–54)
AST: 21 U/L (ref 15–41)
Anion gap: 7 (ref 5–15)
BILIRUBIN TOTAL: 0.5 mg/dL (ref 0.3–1.2)
BUN: 10 mg/dL (ref 6–20)
CALCIUM: 8.9 mg/dL (ref 8.9–10.3)
CO2: 23 mmol/L (ref 22–32)
Chloride: 105 mmol/L (ref 101–111)
Creatinine, Ser: 0.48 mg/dL (ref 0.44–1.00)
GFR calc Af Amer: >60 mL/min (ref >60)
GFR calc non Af Amer: >60 mL/min (ref >60)
GLUCOSE: 97 mg/dL (ref 65–99)
Potassium: 3.6 mmol/L (ref 3.5–5.1)
Sodium: 135 mmol/L (ref 135–145)
TOTAL PROTEIN: 6.5 g/dL (ref 6.5–8.1)

## 2016-12-12 LAB — URINALYSIS, ROUTINE W REFLEX MICROSCOPIC
Bilirubin Urine: NEGATIVE
Glucose, UA: NEGATIVE mg/dL
Ketones, ur: NEGATIVE mg/dL
Leukocytes, UA: NEGATIVE
Nitrite: NEGATIVE
PH: 5 (ref 5.0–8.0)
Protein, ur: NEGATIVE mg/dL
SPECIFIC GRAVITY, URINE: 1.027 (ref 1.005–1.030)

## 2016-12-12 MED ORDER — ONDANSETRON 8 MG PO TBDP
8.0000 mg | ORAL_TABLET | Freq: Once | ORAL | Status: AC
  Administered 2016-12-12: 8 mg via ORAL
  Filled 2016-12-12: qty 1

## 2016-12-12 NOTE — MAU Note (Signed)
Patient unable to get urine sample.  

## 2016-12-12 NOTE — MAU Provider Note (Signed)
Patient Stacey [name] is a 41 year old G5P3013 at [redacted]w[redacted]d Here with complaints of nausea/vomiting and diarrhea since yesterday. She reports that both of her children have had the same symptoms over the past weeks.  History     CSN: [id number]  Arrival date and time: 12/12/16 1745   None     Chief Complaint  Patient presents with  . Diarrhea   Diarrhea   This is a new problem. The current episode started yesterday. The problem occurs more than 10 times per day. The problem has been resolved. Associated symptoms include abdominal pain and vomiting. Pertinent negatives include no chills or coughing. Nothing aggravates the symptoms. Risk factors include ill contacts. She has tried nothing for the symptoms.  Emesis   This is a new problem. The current episode started today. The problem occurs 2 to 4 times per day (she ate crackers and has kept them down while in MAU; has also kept down ginger ale). The problem has been gradually worsening. The emesis has an appearance of bile. There has been no fever. Associated symptoms include abdominal pain, diarrhea and dizziness. Pertinent negatives include no chills or coughing. Risk factors include ill contacts.    OB History    Gravida Para Term Preterm AB Living   SAB TAB Ectopic Multiple Live Births     1   0 3      Past Medical History:  Diagnosis Date  . Anxiety   . Asthma   . PONV (postoperative nausea and vomiting)     Past Surgical History:  Procedure Laterality Date  . APPENDECTOMY    . LEEP    . LEEP    . RHINOPLASTY    . WISDOM TOOTH EXTRACTION      Family History  Problem Relation Age of Onset  . Alcohol abuse Neg Hx   . Arthritis Neg Hx   . Asthma Neg Hx   . Birth defects Neg Hx   . Cancer Neg Hx   . COPD Neg Hx   . Depression Neg Hx   . Diabetes Neg Hx   . Drug abuse Neg Hx   . Early death Neg Hx   . Hearing loss Neg Hx   . Heart disease Neg Hx   . Hypertension Neg Hx   . Hyperlipidemia Neg Hx   .  Kidney disease Neg Hx   . Learning disabilities Neg Hx   . Mental illness Neg Hx   . Mental retardation Neg Hx   . Miscarriages / Stillbirths Neg Hx   . Stroke Neg Hx   . Vision loss Neg Hx   . Varicose Veins Neg Hx     Social History  Substance Use Topics  . Smoking status: Former Smoker    Packs/day: 0.25  . Smokeless tobacco: Never Used  . Alcohol use No    Allergies:  Allergies  Allergen Reactions  . Stadol [Butorphanol] Other (See Comments)    Halucinating     Prescriptions Prior to Admission  Medication Sig Dispense Refill Last Dose  . calcium carbonate (TUMS - DOSED IN MG ELEMENTAL CALCIUM) 500 MG chewable tablet Chew 1 tablet by mouth daily as needed for indigestion or heartburn.   12/10/2016  . Prenatal Vit-Fe Fumarate-FA (PRENATAL MULTIVITAMIN) TABS tablet Take 1 tablet by mouth daily at 12 noon.   12/11/2016 at Unknown time  . albuterol (PROVENTIL HFA;VENTOLIN HFA) 108 (90 Base) MCG/ACT inhaler Inhale 1-2 puffs  into the lungs every 6 (six) hours as needed for wheezing or shortness of breath.   emergency  . budesonide-formoterol (SYMBICORT) 80-4.5 MCG/ACT inhaler Inhale 2 puffs into the lungs 2 (two) times daily as needed (for sob).   for flare-up  . cephALEXin (KEFLEX) 500 MG capsule Take 1 capsule (500 mg total) by mouth 3 (three) times daily. (Patient not taking: Reported on 12/12/2016) 30 capsule 0 Completed Course at Unknown time    Review of Systems  Constitutional: Negative for chills.  Respiratory: Negative.  Negative for cough.   Cardiovascular: Negative.   Gastrointestinal: Positive for abdominal pain, diarrhea, nausea and vomiting.       Had some abdominal pain yesterday  Genitourinary: Negative for vaginal bleeding and vaginal discharge.  Neurological: Positive for dizziness.  Psychiatric/Behavioral: Negative.    Physical Exam   Blood pressure 98/65, pulse 76, temperature 99 F (37.2 C), temperature source Oral, resp. rate 18, height  (1.651 m),  weight 60.8 kg (134 lb 1.9 oz), last menstrual period 08/19/2016, unknown if currently breastfeeding.  Physical Exam  Constitutional: She is oriented to person, place, and time. She appears well-developed.  HENT:  Head: Normocephalic.  Neck: Normal range of motion.  Respiratory: Effort normal.  GI: Soft. She exhibits no distension and no mass. There is no tenderness. There is no rebound and no guarding.  Musculoskeletal: Normal range of motion.  Neurological: She is alert and oriented to person, place, and time. She has normal reflexes.  Skin: Skin is warm and dry.   Results for orders placed or performed during the hospital encounter of 12/12/16 (from the past 24 hour(s))  Urinalysis, Routine w reflex microscopic     Status: Abnormal   Collection Time: 12/12/16  6:29 PM  Result Value Ref Range   Color, Urine YELLOW YELLOW   APPearance HAZY (A) CLEAR   Specific Gravity, Urine 1.027 1.005 - 1.030   pH 5.0 5.0 - 8.0   Glucose, UA NEGATIVE NEGATIVE mg/dL   Hgb urine dipstick SMALL (A) NEGATIVE   Bilirubin Urine NEGATIVE NEGATIVE   Ketones, ur NEGATIVE NEGATIVE mg/dL   Protein, ur NEGATIVE NEGATIVE mg/dL   Nitrite NEGATIVE NEGATIVE   Leukocytes, UA NEGATIVE NEGATIVE   RBC / HPF 0-5 0 - 5 RBC/hpf   WBC, UA 0-5 0 - 5 WBC/hpf   Bacteria, UA RARE (A) NONE SEEN   Squamous Epithelial / LPF 6-30 (A) NONE SEEN   Mucous PRESENT   Comprehensive metabolic panel     Status: None   Collection Time: 12/12/16  8:14 PM  Result Value Ref Range   Sodium 135 135 - 145 mmol/L   Potassium 3.6 3.5 - 5.1 mmol/L   Chloride 105 101 - 111 mmol/L   CO2 23 22 - 32 mmol/L   Glucose, Bld 97 65 - 99 mg/dL   BUN 10 6 - 20 mg/dL   Creatinine, Ser 1.61 0.44 - 1.00 mg/dL   Calcium 8.9 8.9 - 09.6 mg/dL   Total Protein 6.5 6.5 - 8.1 g/dL   Albumin 3.7 3.5 - 5.0 g/dL   AST 21 15 - 41 U/L   ALT 18 14 - 54 U/L   Alkaline Phosphatase 48 38 - 126 U/L   Total Bilirubin 0.5 0.3 - 1.2 mg/dL   GFR calc non Af Amer  >60 >60 mL/min   GFR calc Af Amer >60 >60 mL/min   Anion gap 7 5 - 15     MAU Course  Procedures  MDM -  FHT Doppler 148 -Crackers and ginger ale in MAU, tolerated well -UA: normal, no signs of dehydration -CMP pending Plan of care discussed with Dr. Henderson Cloud; patient stable for discharge once her N/V is under control and once she is tolerating PO Patient care endorsed to Thressa Sheller CNM at 809 pm.  2100: Patient tolerating PO at this time. Still having diarrhea, but able to PO hydrate. Will DC home with instructions on BRAT diet and PO hydration.   Assessment and Plan   1. Viral gastroenteritis   2. [redacted] weeks gestation of pregnancy    DC home Comfort measures reviewed  2nd Trimester precautions  Bleeding precautions RX: none  Return to MAU as needed FU with OB as planned  Follow-up Information    Marigene Erler A, MD Follow up.   Specialty:  Obstetrics and Gynecology Contact information: [address] [place] [place] [address] [phone]            Charlesetta Garibaldi [facility] 12/12/2016, 7:44 PM

## 2016-12-12 NOTE — Discharge Instructions (Signed)
Food Choices to Help Relieve Diarrhea, Adult When you have diarrhea, the foods you eat and your eating habits are very important. Choosing the right foods and drinks can help:  Relieve diarrhea.  Replace lost fluids and nutrients.  Prevent dehydration.  What general guidelines should I follow? Relieving diarrhea  Choose foods with less than 2 g or .07 oz. of fiber per serving.  Limit fats to less than 8 tsp (38 g or 1.34 oz.) a day.  Avoid the following: ? Foods and beverages sweetened with high-fructose corn syrup, honey, or sugar alcohols such as xylitol, sorbitol, and mannitol. ? Foods that contain a lot of fat or sugar. ? Fried, greasy, or spicy foods. ? High-fiber grains, breads, and cereals. ? Raw fruits and vegetables.  Eat foods that are rich in probiotics. These foods include dairy products such as yogurt and fermented milk products. They help increase healthy bacteria in the stomach and intestines (gastrointestinal tract, or GI tract).  If you have lactose intolerance, avoid dairy products. These may make your diarrhea worse.  Take medicine to help stop diarrhea (antidiarrheal medicine) only as told by your health care provider. Replacing nutrients  Eat small meals or snacks every 3-4 hours.  Eat bland foods, such as white rice, toast, or baked potato, until your diarrhea starts to get better. Gradually reintroduce nutrient-rich foods as tolerated or as told by your health care provider. This includes: ? Well-cooked protein foods. ? Peeled, seeded, and soft-cooked fruits and vegetables. ? Low-fat dairy products.  Take vitamin and mineral supplements as told by your health care provider. Preventing dehydration   Start by sipping water or a special solution to prevent dehydration (oral rehydration solution, ORS). Urine that is clear or pale yellow means that you are getting enough fluid.  Try to drink at least 8-10 cups of fluid each day to help replace lost  fluids.  You may add other liquids in addition to water, such as clear juice or decaffeinated sports drinks, as tolerated or as told by your health care provider.  Avoid drinks with caffeine, such as coffee, tea, or soft drinks.  Avoid alcohol. What foods are recommended? The items listed may not be a complete list. Talk with your health care provider about what dietary choices are best for you. Grains White rice. White, French, or pita breads (fresh or toasted), including plain rolls, buns, or bagels. White pasta. Saltine, soda, or graham crackers. Pretzels. Low-fiber cereal. Cooked cereals made with water (such as cornmeal, farina, or cream cereals). Plain muffins. Matzo. Melba toast. Zwieback. Vegetables Potatoes (without the skin). Most well-cooked and canned vegetables without skins or seeds. Tender lettuce. Fruits Apple sauce. Fruits canned in juice. Cooked apricots, cherries, grapefruit, peaches, pears, or plums. Fresh bananas and cantaloupe. Meats and other protein foods Baked or boiled chicken. Eggs. Tofu. Fish. Seafood. Smooth nut butters. Ground or well-cooked tender beef, ham, veal, lamb, pork, or poultry. Dairy Plain yogurt, kefir, and unsweetened liquid yogurt. Lactose-free milk, buttermilk, skim milk, or soy milk. Low-fat or nonfat hard cheese. Beverages Water. Low-calorie sports drinks. Fruit juices without pulp. Strained tomato and vegetable juices. Decaffeinated teas. Sugar-free beverages not sweetened with sugar alcohols. Oral rehydration solutions, if approved by your health care provider. Seasoning and other foods Bouillon, broth, or soups made from recommended foods. What foods are not recommended? The items listed may not be a complete list. Talk with your health care provider about what dietary choices are best for you. Grains Whole grain, whole wheat,   bran, or rye breads, rolls, pastas, and crackers. Wild or brown rice. Whole grain or bran cereals. Barley. Oats and  oatmeal. Corn tortillas or taco shells. Granola. Popcorn. Vegetables Raw vegetables. Fried vegetables. Cabbage, broccoli, Brussels sprouts, artichokes, baked beans, beet greens, corn, kale, legumes, peas, sweet potatoes, and yams. Potato skins. Cooked spinach and cabbage. Fruits Dried fruit, including raisins and dates. Raw fruits. Stewed or dried prunes. Canned fruits with syrup. Meat and other protein foods Fried or fatty meats. Deli meats. Chunky nut butters. Nuts and seeds. Beans and lentils. Bacon. Hot dogs. Sausage. Dairy High-fat cheeses. Whole milk, chocolate milk, and beverages made with milk, such as milk shakes. Half-and-half. Cream. sour cream. Ice cream. Beverages Caffeinated beverages (such as coffee, tea, soda, or energy drinks). Alcoholic beverages. Fruit juices with pulp. Prune juice. Soft drinks sweetened with high-fructose corn syrup or sugar alcohols. High-calorie sports drinks. Fats and oils Butter. Cream sauces. Margarine. Salad oils. Plain salad dressings. Olives. Avocados. Mayonnaise. Sweets and desserts Sweet rolls, doughnuts, and sweet breads. Sugar-free desserts sweetened with sugar alcohols such as xylitol and sorbitol. Seasoning and other foods Honey. Hot sauce. Chili powder. Gravy. Cream-based or milk-based soups. Pancakes and waffles. Summary  When you have diarrhea, the foods you eat and your eating habits are very important.  Make sure you get at least 8-10 cups of fluid each day, or enough to keep your urine clear or pale yellow.  Eat bland foods and gradually reintroduce healthy, nutrient-rich foods as tolerated, or as told by your health care provider.  Avoid high-fiber, fried, greasy, or spicy foods. This information is not intended to replace advice given to you by your health care provider. Make sure you discuss any questions you have with your health care provider. Document Released: 10/26/2003 Document Revised: 08/02/2016 Document Reviewed:  08/02/2016 Elsevier Interactive Patient Education  2017 [employer]  

## 2016-12-12 NOTE — MAU Note (Signed)
Pt reports she started having diarrhea this morning. Had nausea yesterday and vomited once. No more vomiting. Both children had stomach bugs last week.

## 2017-05-07 LAB — OB RESULTS CONSOLE GBS: GBS: NEGATIVE

## 2017-05-29 ENCOUNTER — Other Ambulatory Visit: Payer: Self-pay | Admitting: Obstetrics & Gynecology

## 2017-05-30 ENCOUNTER — Encounter (HOSPITAL_COMMUNITY): Payer: Self-pay | Admitting: *Deleted

## 2017-05-30 ENCOUNTER — Telehealth (HOSPITAL_COMMUNITY): Payer: Self-pay | Admitting: *Deleted

## 2017-05-30 NOTE — Telephone Encounter (Signed)
Preadmission screen  

## 2017-06-04 ENCOUNTER — Inpatient Hospital Stay (HOSPITAL_COMMUNITY): Payer: Self-pay | Admitting: Anesthesiology

## 2017-06-04 ENCOUNTER — Encounter (HOSPITAL_COMMUNITY): Payer: Self-pay

## 2017-06-04 ENCOUNTER — Inpatient Hospital Stay (HOSPITAL_COMMUNITY)
Admission: RE | Admit: 2017-06-04 | Discharge: 2017-06-05 | DRG: 807 | Disposition: A | Discharge status: Home / Self Care | Payer: Self-pay | Source: Ambulatory Visit | Attending: Obstetrics & Gynecology | Admitting: Obstetrics & Gynecology

## 2017-06-04 DIAGNOSIS — Z87891 Personal history of nicotine dependence: Secondary | ICD-10-CM

## 2017-06-04 DIAGNOSIS — O26893 Other specified pregnancy related conditions, third trimester: Principal | ICD-10-CM | POA: Diagnosis present

## 2017-06-04 DIAGNOSIS — Z3A39 39 weeks gestation of pregnancy: Secondary | ICD-10-CM

## 2017-06-04 LAB — CBC
HEMATOCRIT: 30.6 % — AB (ref 36.0–46.0)
HEMOGLOBIN: 10.6 g/dL — AB (ref 12.0–15.0)
MCH: 33.7 pg (ref 26.0–34.0)
MCHC: 34.6 g/dL (ref 30.0–36.0)
MCV: 97.1 fL (ref 78.0–100.0)
Platelets: 164 10*3/uL (ref 150–400)
RBC: 3.15 MIL/uL — ABNORMAL LOW (ref 3.87–5.11)
RDW: 13.4 % (ref 11.5–15.5)
WBC: 8 10*3/uL (ref 4.0–10.5)

## 2017-06-04 LAB — TYPE AND SCREEN
ABO/RH(D): O POS
ANTIBODY SCREEN: NEGATIVE

## 2017-06-04 LAB — RPR: RPR Ser Ql: NONREACTIVE

## 2017-06-04 MED ORDER — LIDOCAINE HCL (PF) 1 % IJ SOLN
INTRAMUSCULAR | Status: DC | PRN
  Administered 2017-06-04 (×2): 4 mL via EPIDURAL

## 2017-06-04 MED ORDER — OXYTOCIN 40 UNITS IN LACTATED RINGERS INFUSION - SIMPLE MED
1.0000 m[IU]/min | INTRAVENOUS | Status: DC
  Administered 2017-06-04: 2 m[IU]/min via INTRAVENOUS
  Filled 2017-06-04: qty 1000

## 2017-06-04 MED ORDER — BENZOCAINE-MENTHOL 20-0.5 % EX AERO: 1.0000 "application " | INHALATION_SPRAY | CUTANEOUS | Status: DC | PRN

## 2017-06-04 MED ORDER — OXYTOCIN 40 UNITS IN LACTATED RINGERS INFUSION - SIMPLE MED: 2.5000 [IU]/h | INTRAVENOUS | Status: DC

## 2017-06-04 MED ORDER — OXYCODONE-ACETAMINOPHEN 5-325 MG PO TABS: 1.0000 | ORAL_TABLET | ORAL | Status: DC | PRN

## 2017-06-04 MED ORDER — PRENATAL MULTIVITAMIN CH
1.0000 | ORAL_TABLET | Freq: Every day | ORAL | Status: DC
  Administered 2017-06-05: 1 via ORAL
  Filled 2017-06-04: qty 1

## 2017-06-04 MED ORDER — DIPHENHYDRAMINE HCL 25 MG PO CAPS: 25.0000 mg | ORAL_CAPSULE | Freq: Four times a day (QID) | ORAL | Status: DC | PRN

## 2017-06-04 MED ORDER — METHYLERGONOVINE MALEATE 0.2 MG/ML IJ SOLN: 0.2000 mg | INTRAMUSCULAR | Status: DC | PRN

## 2017-06-04 MED ORDER — ONDANSETRON HCL 4 MG/2ML IJ SOLN: 4.0000 mg | INTRAMUSCULAR | Status: DC | PRN

## 2017-06-04 MED ORDER — TERBUTALINE SULFATE 1 MG/ML IJ SOLN
0.2500 mg | Freq: Once | INTRAMUSCULAR | Status: DC | PRN
  Filled 2017-06-04: qty 1

## 2017-06-04 MED ORDER — ZOLPIDEM TARTRATE 5 MG PO TABS: 5.0000 mg | ORAL_TABLET | Freq: Every evening | ORAL | Status: DC | PRN

## 2017-06-04 MED ORDER — WITCH HAZEL-GLYCERIN EX PADS: 1.0000 "application " | MEDICATED_PAD | CUTANEOUS | Status: DC | PRN

## 2017-06-04 MED ORDER — ACETAMINOPHEN 325 MG PO TABS
650.0000 mg | ORAL_TABLET | ORAL | Status: DC | PRN
  Administered 2017-06-04: 650 mg via ORAL
  Filled 2017-06-04 (×2): qty 2

## 2017-06-04 MED ORDER — OXYCODONE-ACETAMINOPHEN 5-325 MG PO TABS: 2.0000 | ORAL_TABLET | ORAL | Status: DC | PRN

## 2017-06-04 MED ORDER — PHENYLEPHRINE 40 MCG/ML (10ML) SYRINGE FOR IV PUSH (FOR BLOOD PRESSURE SUPPORT)
80.0000 ug | PREFILLED_SYRINGE | INTRAVENOUS | Status: DC | PRN
  Filled 2017-06-04: qty 10
  Filled 2017-06-04: qty 5

## 2017-06-04 MED ORDER — METHYLERGONOVINE MALEATE 0.2 MG PO TABS: 0.2000 mg | ORAL_TABLET | ORAL | Status: DC | PRN

## 2017-06-04 MED ORDER — ACETAMINOPHEN 325 MG PO TABS: 650.0000 mg | ORAL_TABLET | ORAL | Status: DC | PRN

## 2017-06-04 MED ORDER — LACTATED RINGERS IV SOLN: 500.0000 mL | INTRAVENOUS | Status: DC | PRN

## 2017-06-04 MED ORDER — SENNOSIDES-DOCUSATE SODIUM 8.6-50 MG PO TABS: 2.0000 | ORAL_TABLET | ORAL | Status: DC

## 2017-06-04 MED ORDER — ONDANSETRON HCL 4 MG/2ML IJ SOLN: 4.0000 mg | Freq: Four times a day (QID) | INTRAMUSCULAR | Status: DC | PRN

## 2017-06-04 MED ORDER — SIMETHICONE 80 MG PO CHEW: 80.0000 mg | CHEWABLE_TABLET | ORAL | Status: DC | PRN

## 2017-06-04 MED ORDER — PHENYLEPHRINE 40 MCG/ML (10ML) SYRINGE FOR IV PUSH (FOR BLOOD PRESSURE SUPPORT)
80.0000 ug | PREFILLED_SYRINGE | INTRAVENOUS | Status: DC | PRN
  Filled 2017-06-04: qty 5

## 2017-06-04 MED ORDER — LACTATED RINGERS IV SOLN
INTRAVENOUS | Status: DC
  Administered 2017-06-04: 08:00:00 via INTRAVENOUS

## 2017-06-04 MED ORDER — DIPHENHYDRAMINE HCL 50 MG/ML IJ SOLN: 12.5000 mg | INTRAMUSCULAR | Status: DC | PRN

## 2017-06-04 MED ORDER — ONDANSETRON HCL 4 MG PO TABS: 4.0000 mg | ORAL_TABLET | ORAL | Status: DC | PRN

## 2017-06-04 MED ORDER — TETANUS-DIPHTH-ACELL PERTUSSIS 5-2.5-18.5 LF-MCG/0.5 IM SUSP: 0.5000 mL | Freq: Once | INTRAMUSCULAR | Status: DC

## 2017-06-04 MED ORDER — IBUPROFEN 600 MG PO TABS
600.0000 mg | ORAL_TABLET | Freq: Four times a day (QID) | ORAL | Status: DC
  Administered 2017-06-04 – 2017-06-05 (×4): 600 mg via ORAL
  Filled 2017-06-04 (×4): qty 1

## 2017-06-04 MED ORDER — LIDOCAINE HCL (PF) 1 % IJ SOLN
30.0000 mL | INTRAMUSCULAR | Status: DC | PRN
  Filled 2017-06-04: qty 30

## 2017-06-04 MED ORDER — EPHEDRINE 5 MG/ML INJ
10.0000 mg | INTRAVENOUS | Status: DC | PRN
  Filled 2017-06-04: qty 2

## 2017-06-04 MED ORDER — SOD CITRATE-CITRIC ACID 500-334 MG/5ML PO SOLN: 30.0000 mL | ORAL | Status: DC | PRN

## 2017-06-04 MED ORDER — FENTANYL 2.5 MCG/ML BUPIVACAINE 1/10 % EPIDURAL INFUSION (WH - ANES)
14.0000 mL/h | INTRAMUSCULAR | Status: DC | PRN
  Administered 2017-06-04: 14 mL/h via EPIDURAL
  Filled 2017-06-04: qty 100

## 2017-06-04 MED ORDER — LACTATED RINGERS IV SOLN: 500.0000 mL | Freq: Once | INTRAVENOUS | Status: DC

## 2017-06-04 MED ORDER — DIBUCAINE 1 % RE OINT: 1.0000 "application " | TOPICAL_OINTMENT | RECTAL | Status: DC | PRN

## 2017-06-04 MED ORDER — OXYTOCIN 40 UNITS IN LACTATED RINGERS INFUSION - SIMPLE MED: 2.5000 [IU]/h | INTRAVENOUS | Status: DC | PRN

## 2017-06-04 MED ORDER — COCONUT OIL OIL: 1.0000 "application " | TOPICAL_OIL | Status: DC | PRN

## 2017-06-04 MED ORDER — OXYTOCIN BOLUS FROM INFUSION
500.0000 mL | Freq: Once | INTRAVENOUS | Status: AC
  Administered 2017-06-04: 500 mL via INTRAVENOUS

## 2017-06-04 NOTE — Anesthesia Pain Management Evaluation Note (Signed)
  [facility] Pain Management Visit Note  Patient: Stacey [name], 41 y.o., female  "Hello [facility] am a [facility]. We have an anesthesia team available at all times to provide care throughout the hospital, including epidural management and anesthesia for C-section. I don't know your plan for the delivery whether it a natural birth, water birth, IV sedation, nitrous supplementation, doula or epidural, but we want to meet your pain goals."   1.Was your pain managed to your expectations on prior hospitalizations?   Yes   2.What is your expectation for pain management during this hospitalization?     Epidural  3.How can we help you reach that goal? Epidural when pain goal reached  Record the patient's initial score and the patient's pain goal.   Pain: 0  Pain Goal: 6 [facility] wants you to be able to say your pain was always managed very well.  Cleda ClarksBrowder, Larz Mark R 06/04/2017

## 2017-06-04 NOTE — Progress Notes (Signed)
Michaelle Birksli Weisbecker is a 41 y.o. G4P3003 at 7361w5d by LMP [facility]  Subjective: Patient still comfortable not in pain  Objective: BP 102/62   Pulse 62   Temp 98.4 F (36.9 C) (Oral)   Resp 20   Ht 5\' 5"  (1.651 m)   Wt 74.8 kg (165 lb)   LMP 08/19/2016   BMI 27.46 kg/m  No intake/output data recorded. No intake/output data recorded.  FHT:  FHR: 140 bpm, variability: moderate,  accelerations:  Present,  decelerations:  Absent UC:   irregular, every 4 minutes SVE:   Dilation: 4 Effacement (%): 60 Station: -2 Exam by:: Dr. Mora ApplPinn  AROM done clear fluid  Labs: Lab Results  Component Value Date   WBC 8.0 06/04/2017   HGB 10.6 (L) 06/04/2017   HCT 30.6 (L) 06/04/2017   MCV 97.1 06/04/2017   PLT 164 06/04/2017    Assessment / Plan: Induction of labor due to AMA at term,  progressing well on pitocin  Labor: Progressing on Pitocin, AROM done Preeclampsia:  no signs or symptoms of toxicity Fetal Wellbeing:  Category I Pain Control:  Labor support without medications I/D:  n/a Anticipated MOD:  NSVD  Deejay Koppelman STACIA 06/04/2017, 1:26 PM

## 2017-06-04 NOTE — Anesthesia Preprocedure Evaluation (Signed)
Anesthesia Evaluation  Patient identified by MRN, date of birth, ID band Patient awake    Reviewed: Allergy & Precautions, Patient's Chart, lab work & pertinent test results  History of Anesthesia Complications (+) PONV and history of anesthetic complications  Airway Mallampati: II  TM Distance: >3 FB Neck ROM: Full    Dental no notable dental hx. (+) Teeth Intact   Pulmonary asthma , former smoker,    Pulmonary exam normal breath sounds clear to auscultation       Cardiovascular negative cardio ROS Normal cardiovascular exam Rhythm:Regular Rate:Normal     Neuro/Psych PSYCHIATRIC DISORDERS Anxiety ADDnegative neurological ROS     GI/Hepatic Neg liver ROS, GERD  Controlled and Medicated,  Endo/Other  negative endocrine ROS  Renal/GU negative Renal ROS  negative genitourinary   Musculoskeletal negative musculoskeletal ROS (+)   Abdominal   Peds  Hematology  (+) anemia ,   Anesthesia Other Findings   Reproductive/Obstetrics (+) Pregnancy AMA                             Anesthesia Physical Anesthesia Plan  ASA: II  Anesthesia Plan: Epidural   Post-op Pain Management:    Induction:   PONV Risk Score and Plan:   Airway Management Planned: Natural Airway  Additional Equipment:   Intra-op Plan:   Post-operative Plan:   Informed Consent: I have reviewed the patients History and Physical, chart, labs and discussed the procedure including the risks, benefits and alternatives for the proposed anesthesia with the patient or authorized representative who has indicated his/her understanding and acceptance.     Plan Discussed with: Anesthesiologist  Anesthesia Plan Comments:         Anesthesia Quick Evaluation

## 2017-06-04 NOTE — H&P (Signed)
Stacey [name] is a 41 y.o. female presenting for induction of labor d/t AMA.  She is having irregular ctx, no leaking of fluid, no vaginal bleeding. She reports good fetal movement.s  Her prenatal course has been uncomplicated  OB History    Gravida Para Term Preterm AB Living   4 3 3    0 3   SAB TAB Ectopic Multiple Live Births     0   0 3     Past Medical History:  Diagnosis Date  . ADD (attention deficit disorder)   . Anxiety   . Asthma   . PONV (postoperative nausea and vomiting)   . Vaginal Pap smear, abnormal    Past Surgical History:  Procedure Laterality Date  . APPENDECTOMY    . LEEP    . LEEP    . RHINOPLASTY    . WISDOM TOOTH EXTRACTION     Family History: family history is not on file. Social History:  reports that she has quit smoking. She smoked 0.25 packs per day. She has never used smokeless tobacco. She reports that she does not drink alcohol or use drugs.     Maternal Diabetes: No Genetic Screening: Normal Maternal Ultrasounds/Referrals: Normal Fetal Ultrasounds or other Referrals:  None Maternal Substance Abuse:  No Significant Maternal Medications:  None Significant Maternal Lab Results:  Lab values include: Group B Strep negative Other Comments:  None  Review of Systems  Constitutional: Negative.   HENT: Negative.   Eyes: Negative.   Skin: Negative.   All other systems reviewed and are negative.  Maternal Medical History:  Contractions: Onset was less than 1 hour ago.   Frequency: regular.   Perceived severity is mild.    Fetal activity: Perceived fetal activity is normal.   Last perceived fetal movement was within the past hour.    Prenatal complications: no prenatal complications Prenatal Complications - Diabetes: none.    Dilation: 3 Effacement (%): 60 Station: -3 Exam by:: Raliegh Ipatherine Stout RN Blood pressure 109/65, pulse 70, temperature 98.5 F (36.9 C), temperature source Oral, resp. rate 20, height 5\' 5"  (1.651 m), weight 74.8  kg (165 lb), last menstrual period 08/19/2016, unknown if currently breastfeeding. Maternal Exam:  Uterine Assessment: Contraction strength is mild.  Contraction frequency is irregular.   Abdomen: Patient reports no abdominal tenderness. Fundal height is 39cm.   Estimated fetal weight is 3000 grams.   Fetal presentation: vertex  Introitus: Normal vulva. Normal vagina.  Ferning test: not done.  Nitrazine test: not done. Amniotic fluid character: not assessed.  Pelvis: adequate for delivery.   Cervix: Cervix evaluated by digital exam.   3/80/-1  Fetal Exam Fetal Monitor Review: Baseline rate: 145.  Variability: moderate (6-25 bpm).   Pattern: no decelerations and accelerations present.    Fetal State Assessment: Category I - tracings are normal.     Physical Exam  Nursing note and vitals reviewed.   Prenatal labs: ABO, Rh: O/Positive/-- (03/27 0000) Antibody: Negative (03/27 0000) Rubella: Immune (03/27 0000) RPR: Nonreactive (03/27 0000)  HBsAg: Negative (03/27 0000)  HIV: Non-reactive (03/27 0000)  GBS: Negative (09/19 0000)   Assessment/Plan: 41 year old G2P1001 at 39 weeks 5 days for IOL Admit to Labor and Delivery Pitocin for induction Epidural on demand AROM for augmentation Continuous monitoring   Stacey [name] Stacey [name] 06/04/2017, 8:52 AM

## 2017-06-04 NOTE — Anesthesia Procedure Notes (Signed)
Epidural Patient location during procedure: OB Start time: 06/04/2017 3:00 PM  Staffing Anesthesiologist: Mal AmabileFOSTER, Afnan Cadiente Performed: anesthesiologist   Preanesthetic Checklist Completed: patient identified, site marked, surgical consent, pre-op evaluation, timeout performed, IV checked, risks and benefits discussed and monitors and equipment checked  Epidural Patient position: sitting Prep: site prepped and draped and DuraPrep Patient monitoring: continuous pulse ox and blood pressure Approach: midline Location: L4-L5 Injection technique: LOR air  Needle:  Needle type: Tuohy  Needle gauge: 17 G Needle length: 9 cm and 9 Needle insertion depth: 5 cm cm Catheter type: closed end flexible Catheter size: 19 Gauge Catheter at skin depth: 10 cm Test dose: negative and Other  Assessment Events: blood not aspirated, injection not painful, no injection resistance, negative IV test and no paresthesia  Additional Notes Patient identified. Risks and benefits discussed including failed block, incomplete  Pain control, post dural puncture headache, nerve damage, paralysis, blood pressure Changes, nausea, vomiting, reactions to medications-both toxic and allergic and post Partum back pain. All questions were answered. Patient expressed understanding and wished to proceed. Sterile technique was used throughout procedure. Epidural site was Dressed with sterile barrier dressing. No paresthesias, signs of intravascular injection Or signs of intrathecal spread were encountered. Attempt x 2 Poor position Patient was more comfortable after the epidural was dosed. Please see RN's note for documentation of vital signs and FHR which are stable.

## 2017-06-05 LAB — CBC
HEMATOCRIT: 31.1 % — AB (ref 36.0–46.0)
HEMOGLOBIN: 10.7 g/dL — AB (ref 12.0–15.0)
MCH: 33.3 pg (ref 26.0–34.0)
MCHC: 34.4 g/dL (ref 30.0–36.0)
MCV: 96.9 fL (ref 78.0–100.0)
Platelets: 176 10*3/uL (ref 150–400)
RBC: 3.21 MIL/uL — ABNORMAL LOW (ref 3.87–5.11)
RDW: 13.4 % (ref 11.5–15.5)
WBC: 9.9 10*3/uL (ref 4.0–10.5)

## 2017-06-05 MED ORDER — IBUPROFEN 600 MG PO TABS: 600.0000 mg | ORAL_TABLET | Freq: Four times a day (QID) | ORAL | 0 refills | Status: AC

## 2017-06-05 NOTE — Progress Notes (Signed)
MOB was referred for history of depression/anxiety. * Referral screened out by Clinical Social Worker because none of the following criteria appear to apply: ~ History of anxiety/depression during this pregnancy, or of post-partum depression. ~ Diagnosis of anxiety and/or depression within last 3 years; no concerns noted in OB Records. OR * MOB's symptoms currently being treated with medication and/or therapy.  Please contact the Clinical Social Worker if needs arise, by MOB request, or if MOB scores greater than 9/yes to question 10 on Edinburgh Postpartum Depression Screen.  Jessic Standifer Boyd-Gilyard, MSW, LCSW Clinical Social Work [phone]  

## 2017-06-05 NOTE — Anesthesia Postprocedure Evaluation (Incomplete)
Anesthesia Post Note  Patient: Stacey [name]  Procedure(s) Performed: AN AD HOC LABOR EPIDURAL     Patient location during evaluation: Mother Baby Anesthesia Type: Epidural Level of consciousness: awake and alert Pain management: pain level controlled Vital Signs Assessment: vitals unstable Respiratory status: spontaneous breathing Cardiovascular status: stable Postop Assessment: no headache, adequate PO intake, no backache, patient able to bend at knees, epidural receding and no apparent nausea or vomiting Anesthetic complications: no    Last Vitals:  Vitals:   06/05/17 0110 06/05/17 0525  BP: (!) 104/58 106/63  Pulse: 64 67  Resp: 18 18  Temp: 36.9 C 37.1 C  SpO2: 97% 98%    Last Pain:  Vitals:   06/05/17 0715  TempSrc:   PainSc: 0-No pain   Pain Goal:                 Salome ArntSterling, Reid Nawrot Marie

## 2017-06-05 NOTE — Lactation Note (Signed)
This note was copied from a baby's chart. Lactation Consultation Note  Patient Name: Stacey Michaelle Birksli Shader [id number]'ZToday's Date: 06/05/2017 Reason for consult: Initial assessment  Baby 18 hours old. Mom reports that baby has been spitty--and emesis yellow and creamy. Enc holding baby upright, STS, and continuing to nurse with cues. Mom reports that she had mastitis, within first 3 days, with 2 oldest children--oldest is 39 years old. [facility], aware of [facility]/BFSG and LC phone line assistance after D/C.   Maternal Data Has patient been taught Hand Expression?: Yes Does the patient have breastfeeding experience prior to this delivery?: Yes  Feeding Feeding Type: Breast Fed Length of feed: 15 min  LATCH Score                   Interventions    Lactation Tools Discussed/Used     Consult Status Consult Status: Follow-up Date: 06/06/17 Follow-up type: In-patient    Stacey HayJennifer D Luiza [name] 06/05/2017, 12:37 PM

## 2017-06-05 NOTE — Progress Notes (Signed)
Patient is doing well.  She is ambulating, voiding, tolerating PO.  Pain control is good.  Lochia is appropriate  Vitals:   06/04/17 2015 06/04/17 2115 06/05/17 0110 06/05/17 0525  BP: 113/63 104/70 (!) 104/58 106/63  Pulse: (!) 58 77 64 67  Resp: 18 18 18 18   Temp: 99.6 F (37.6 C) 98.6 F (37 C) 98.5 F (36.9 C) 98.7 F (37.1 C)  TempSrc: Oral Oral Oral Oral  SpO2: 99% 98% 97% 98%  Weight:      Height:        NAD Fundus firm Ext: no edema  Lab Results  Component Value Date   WBC 9.9 06/05/2017   HGB 10.7 (L) 06/05/2017   HCT 31.1 (L) 06/05/2017   MCV 96.9 06/05/2017   PLT 176 06/05/2017    --/--/O POS (10/17 0805)/Rimmune  A/P 41 y.o. G4P4004 PPD#1 sp TSVD. Routine care.   Desires discharge at 24 hours--meeting all goals.   Expect d/c this evening.    [facility]tr[facility]ANNA [name] Stacey [name]

## 2017-06-05 NOTE — Discharge Summary (Signed)
Obstetric Discharge Summary Reason for Admission: induction of labor Prenatal Procedures: none Intrapartum Procedures: spontaneous vaginal delivery Postpartum Procedures: none Complications-Operative and Postpartum: none Hemoglobin  Date Value Ref Range Status  06/05/2017 10.7 (L) 12.0 - 15.0 g/dL Final   HCT  Date Value Ref Range Status  06/05/2017 31.1 (L) 36.0 - 46.0 % Final    Physical Exam:  General: alert, cooperative and appears stated age 56Lochia: appropriate Uterine Fundus: firm DVT Evaluation: No evidence of DVT seen on physical exam.  Discharge Diagnoses: Term Pregnancy-delivered  Discharge Information: Date: 06/05/2017 Activity: pelvic rest Diet: routine Medications: Ibuprofen and tylenol Condition: stable Instructions: refer to practice specific booklet Discharge to: home Follow-up Information    Stacey [name], [name], MD Follow up in 4 week(s).   Specialty:  Obstetrics and Gynecology Contact information: [address] [place] [address] [phone]           Newborn Data: Live born female  Birth Weight: 9 lb 4.9 oz (4221 g) APGAR: 8, 9  Newborn Delivery   Birth date/time:  06/04/2017 18:09:00 Delivery type:  Vaginal, Spontaneous Delivery      Home with mother.  Stacey [name] [name] Stacey [name] 06/05/2017, 12:32 PM

## 2017-06-06 NOTE — Anesthesia Postprocedure Evaluation (Signed)
Anesthesia Post Note  Patient: Stacey [name]  Procedure(s) Performed: AN AD HOC LABOR EPIDURAL     Patient location during evaluation: Mother Baby Anesthesia Type: Epidural Level of consciousness: awake and alert Pain management: pain level controlled Vital Signs Assessment: post-procedure vital signs reviewed and stable Respiratory status: spontaneous breathing, nonlabored ventilation and respiratory function stable Cardiovascular status: stable Postop Assessment: no headache, no backache and epidural receding Anesthetic complications: no    Last Vitals:  Vitals:   06/05/17 0525 06/05/17 1819  BP: 106/63 107/68  Pulse: 67 80  Resp: 18 18  Temp: 37.1 C 36.9 C  SpO2: 98%     Last Pain:  Vitals:   06/05/17 1819  TempSrc: Oral  PainSc:    Pain Goal:                 Junious SilkGILBERT,Stacey [name]

## 2019-01-14 DIAGNOSIS — H5213 Myopia, bilateral: Secondary | ICD-10-CM | POA: Diagnosis not present

## 2019-02-12 DIAGNOSIS — F909 Attention-deficit hyperactivity disorder, unspecified type: Secondary | ICD-10-CM | POA: Diagnosis not present

## 2019-02-12 DIAGNOSIS — Z0000 Encounter for general adult medical examination without abnormal findings: Secondary | ICD-10-CM | POA: Diagnosis not present

## 2019-02-17 DIAGNOSIS — F909 Attention-deficit hyperactivity disorder, unspecified type: Secondary | ICD-10-CM | POA: Diagnosis not present

## 2019-02-17 DIAGNOSIS — Z0000 Encounter for general adult medical examination without abnormal findings: Secondary | ICD-10-CM | POA: Diagnosis not present

## 2019-02-17 DIAGNOSIS — F419 Anxiety disorder, unspecified: Secondary | ICD-10-CM | POA: Diagnosis not present

## 2019-02-17 DIAGNOSIS — R0602 Shortness of breath: Secondary | ICD-10-CM | POA: Diagnosis not present

## 2019-04-05 DIAGNOSIS — Z01419 Encounter for gynecological examination (general) (routine) without abnormal findings: Secondary | ICD-10-CM | POA: Diagnosis not present

## 2019-04-05 DIAGNOSIS — Z1231 Encounter for screening mammogram for malignant neoplasm of breast: Secondary | ICD-10-CM | POA: Diagnosis not present

## 2019-04-05 DIAGNOSIS — Z6821 Body mass index (BMI) 21.0-21.9, adult: Secondary | ICD-10-CM | POA: Diagnosis not present

## 2019-04-05 DIAGNOSIS — Z124 Encounter for screening for malignant neoplasm of cervix: Secondary | ICD-10-CM | POA: Diagnosis not present

## 2019-05-24 DIAGNOSIS — F909 Attention-deficit hyperactivity disorder, unspecified type: Secondary | ICD-10-CM | POA: Diagnosis not present

## 2019-05-24 DIAGNOSIS — F419 Anxiety disorder, unspecified: Secondary | ICD-10-CM | POA: Diagnosis not present

## 2019-05-24 DIAGNOSIS — Z309 Encounter for contraceptive management, unspecified: Secondary | ICD-10-CM | POA: Diagnosis not present

## 2019-05-24 DIAGNOSIS — Z79899 Other long term (current) drug therapy: Secondary | ICD-10-CM | POA: Diagnosis not present

## 2019-08-26 ENCOUNTER — Ambulatory Visit: Payer: No Typology Code available for payment source | Attending: Internal Medicine

## 2019-08-26 DIAGNOSIS — Z20822 Contact with and (suspected) exposure to covid-19: Secondary | ICD-10-CM | POA: Insufficient documentation

## 2019-08-28 LAB — NOVEL CORONAVIRUS, NAA: SARS-CoV-2, NAA: NOT DETECTED

## 2019-09-09 DIAGNOSIS — Z309 Encounter for contraceptive management, unspecified: Secondary | ICD-10-CM | POA: Diagnosis not present

## 2019-09-14 DIAGNOSIS — C44519 Basal cell carcinoma of skin of other part of trunk: Secondary | ICD-10-CM | POA: Diagnosis not present

## 2019-09-14 DIAGNOSIS — D485 Neoplasm of uncertain behavior of skin: Secondary | ICD-10-CM | POA: Diagnosis not present

## 2019-09-14 DIAGNOSIS — L905 Scar conditions and fibrosis of skin: Secondary | ICD-10-CM | POA: Diagnosis not present

## 2019-09-14 DIAGNOSIS — L570 Actinic keratosis: Secondary | ICD-10-CM | POA: Diagnosis not present

## 2019-10-12 DIAGNOSIS — C44519 Basal cell carcinoma of skin of other part of trunk: Secondary | ICD-10-CM | POA: Diagnosis not present

## 2019-11-10 DIAGNOSIS — Z20828 Contact with and (suspected) exposure to other viral communicable diseases: Secondary | ICD-10-CM | POA: Diagnosis not present

## 2019-11-10 DIAGNOSIS — Z03818 Encounter for observation for suspected exposure to other biological agents ruled out: Secondary | ICD-10-CM | POA: Diagnosis not present

## 2019-11-17 DIAGNOSIS — F419 Anxiety disorder, unspecified: Secondary | ICD-10-CM | POA: Diagnosis not present

## 2019-11-17 DIAGNOSIS — F909 Attention-deficit hyperactivity disorder, unspecified type: Secondary | ICD-10-CM | POA: Diagnosis not present

## 2019-11-17 DIAGNOSIS — Z79899 Other long term (current) drug therapy: Secondary | ICD-10-CM | POA: Diagnosis not present

## 2019-12-08 DIAGNOSIS — Z309 Encounter for contraceptive management, unspecified: Secondary | ICD-10-CM | POA: Diagnosis not present

## 2019-12-12 DIAGNOSIS — Z20828 Contact with and (suspected) exposure to other viral communicable diseases: Secondary | ICD-10-CM | POA: Diagnosis not present

## 2019-12-12 DIAGNOSIS — Z03818 Encounter for observation for suspected exposure to other biological agents ruled out: Secondary | ICD-10-CM | POA: Diagnosis not present

## 2020-01-27 DIAGNOSIS — H52222 Regular astigmatism, left eye: Secondary | ICD-10-CM | POA: Diagnosis not present

## 2020-01-27 DIAGNOSIS — H5213 Myopia, bilateral: Secondary | ICD-10-CM | POA: Diagnosis not present

## 2020-02-15 DIAGNOSIS — F909 Attention-deficit hyperactivity disorder, unspecified type: Secondary | ICD-10-CM | POA: Diagnosis not present

## 2020-02-15 DIAGNOSIS — Z79899 Other long term (current) drug therapy: Secondary | ICD-10-CM | POA: Diagnosis not present

## 2020-02-15 DIAGNOSIS — F419 Anxiety disorder, unspecified: Secondary | ICD-10-CM | POA: Diagnosis not present

## 2020-03-01 DIAGNOSIS — Z309 Encounter for contraceptive management, unspecified: Secondary | ICD-10-CM | POA: Diagnosis not present

## 2020-05-26 DIAGNOSIS — Z0000 Encounter for general adult medical examination without abnormal findings: Secondary | ICD-10-CM | POA: Diagnosis not present

## 2020-05-26 DIAGNOSIS — Z3040 Encounter for surveillance of contraceptives, unspecified: Secondary | ICD-10-CM | POA: Diagnosis not present

## 2020-05-26 DIAGNOSIS — Z01419 Encounter for gynecological examination (general) (routine) without abnormal findings: Secondary | ICD-10-CM | POA: Diagnosis not present

## 2020-05-26 DIAGNOSIS — Z1231 Encounter for screening mammogram for malignant neoplasm of breast: Secondary | ICD-10-CM | POA: Diagnosis not present

## 2020-05-26 DIAGNOSIS — N390 Urinary tract infection, site not specified: Secondary | ICD-10-CM | POA: Diagnosis not present

## 2020-06-07 DIAGNOSIS — F419 Anxiety disorder, unspecified: Secondary | ICD-10-CM | POA: Diagnosis not present

## 2020-06-07 DIAGNOSIS — Z0000 Encounter for general adult medical examination without abnormal findings: Secondary | ICD-10-CM | POA: Diagnosis not present

## 2020-06-07 DIAGNOSIS — F909 Attention-deficit hyperactivity disorder, unspecified type: Secondary | ICD-10-CM | POA: Diagnosis not present

## 2020-07-11 ENCOUNTER — Emergency Department (HOSPITAL_COMMUNITY)
Admission: EM | Admit: 2020-07-11 | Discharge: 2020-07-11 | Disposition: A | Discharge status: Home / Self Care | Payer: BC Managed Care – PPO | Attending: Emergency Medicine | Admitting: Emergency Medicine

## 2020-07-11 ENCOUNTER — Emergency Department (HOSPITAL_COMMUNITY): Payer: BC Managed Care – PPO

## 2020-07-11 ENCOUNTER — Other Ambulatory Visit: Payer: Self-pay

## 2020-07-11 DIAGNOSIS — Z20822 Contact with and (suspected) exposure to covid-19: Secondary | ICD-10-CM | POA: Diagnosis not present

## 2020-07-11 DIAGNOSIS — Z23 Encounter for immunization: Secondary | ICD-10-CM | POA: Diagnosis not present

## 2020-07-11 DIAGNOSIS — S0121XA Laceration without foreign body of nose, initial encounter: Secondary | ICD-10-CM | POA: Diagnosis not present

## 2020-07-11 DIAGNOSIS — J45909 Unspecified asthma, uncomplicated: Secondary | ICD-10-CM | POA: Insufficient documentation

## 2020-07-11 DIAGNOSIS — Z87891 Personal history of nicotine dependence: Secondary | ICD-10-CM | POA: Insufficient documentation

## 2020-07-11 DIAGNOSIS — H5702 Anisocoria: Secondary | ICD-10-CM | POA: Diagnosis not present

## 2020-07-11 DIAGNOSIS — S022XXA Fracture of nasal bones, initial encounter for closed fracture: Secondary | ICD-10-CM

## 2020-07-11 DIAGNOSIS — Z041 Encounter for examination and observation following transport accident: Secondary | ICD-10-CM | POA: Diagnosis not present

## 2020-07-11 DIAGNOSIS — Z7951 Long term (current) use of inhaled steroids: Secondary | ICD-10-CM | POA: Diagnosis not present

## 2020-07-11 DIAGNOSIS — S0990XA Unspecified injury of head, initial encounter: Secondary | ICD-10-CM | POA: Diagnosis not present

## 2020-07-11 DIAGNOSIS — H5709 Other anomalies of pupillary function: Secondary | ICD-10-CM | POA: Diagnosis not present

## 2020-07-11 DIAGNOSIS — H5700 Unspecified anomaly of pupillary function: Secondary | ICD-10-CM

## 2020-07-11 DIAGNOSIS — Y92410 Unspecified street and highway as the place of occurrence of the external cause: Secondary | ICD-10-CM | POA: Insufficient documentation

## 2020-07-11 DIAGNOSIS — S01511A Laceration without foreign body of lip, initial encounter: Secondary | ICD-10-CM

## 2020-07-11 DIAGNOSIS — R52 Pain, unspecified: Secondary | ICD-10-CM | POA: Diagnosis not present

## 2020-07-11 LAB — COMPREHENSIVE METABOLIC PANEL
ALT: 17 U/L (ref 0–44)
AST: 20 U/L (ref 15–41)
Albumin: 4.2 g/dL (ref 3.5–5.0)
Alkaline Phosphatase: 27 U/L — ABNORMAL LOW (ref 38–126)
Anion gap: 11 (ref 5–15)
BUN: 13 mg/dL (ref 6–20)
CO2: 21 mmol/L — ABNORMAL LOW (ref 22–32)
Calcium: 9.2 mg/dL (ref 8.9–10.3)
Chloride: 107 mmol/L (ref 98–111)
Creatinine, Ser: 0.77 mg/dL (ref 0.44–1.00)
GFR, Estimated: >60 mL/min (ref >60)
Glucose, Bld: 113 mg/dL — ABNORMAL HIGH (ref 70–99)
Potassium: 3.6 mmol/L (ref 3.5–5.1)
Sodium: 139 mmol/L (ref 135–145)
Total Bilirubin: 0.6 mg/dL (ref 0.3–1.2)
Total Protein: 6.6 g/dL (ref 6.5–8.1)

## 2020-07-11 LAB — CBC WITH DIFFERENTIAL/PLATELET
Abs Immature Granulocytes: 0.02 10*3/uL (ref 0.00–0.07)
Basophils Absolute: 0 10*3/uL (ref 0.0–0.1)
Basophils Relative: 1 %
Eosinophils Absolute: 0.4 10*3/uL (ref 0.0–0.5)
Eosinophils Relative: 7 %
HCT: 38.4 % (ref 36.0–46.0)
Hemoglobin: 12.6 g/dL (ref 12.0–15.0)
Immature Granulocytes: 0 %
Lymphocytes Relative: 31 %
Lymphs Abs: 1.9 10*3/uL (ref 0.7–4.0)
MCH: 32.6 pg (ref 26.0–34.0)
MCHC: 32.8 g/dL (ref 30.0–36.0)
MCV: 99.5 fL (ref 80.0–100.0)
Monocytes Absolute: 0.4 10*3/uL (ref 0.1–1.0)
Monocytes Relative: 6 %
Neutro Abs: 3.3 10*3/uL (ref 1.7–7.7)
Neutrophils Relative %: 55 %
Platelets: 300 10*3/uL (ref 150–400)
RBC: 3.86 MIL/uL — ABNORMAL LOW (ref 3.87–5.11)
RDW: 11.8 % (ref 11.5–15.5)
WBC: 6.1 10*3/uL (ref 4.0–10.5)
nRBC: 0 % (ref 0.0–0.2)

## 2020-07-11 LAB — I-STAT BETA HCG BLOOD, ED (MC, WL, AP ONLY): I-stat hCG, quantitative: <5 m[IU]/mL (ref <5)

## 2020-07-11 LAB — RESPIRATORY PANEL BY RT PCR (FLU A&B, COVID)
Influenza A by PCR: NEGATIVE
Influenza B by PCR: NEGATIVE
SARS Coronavirus 2 by RT PCR: NEGATIVE

## 2020-07-11 MED ORDER — OXYCODONE-ACETAMINOPHEN 5-325 MG PO TABS
1.0000 | ORAL_TABLET | Freq: Once | ORAL | Status: AC
  Administered 2020-07-11: 1 via ORAL
  Filled 2020-07-11: qty 1

## 2020-07-11 MED ORDER — LIDOCAINE-EPINEPHRINE 1 %-1:100000 IJ SOLN
20.0000 mL | Freq: Once | INTRAMUSCULAR | Status: AC
  Administered 2020-07-11: 20 mL via INTRADERMAL
  Filled 2020-07-11: qty 1

## 2020-07-11 MED ORDER — PROMETHAZINE HCL 25 MG PO TABS: 25.0000 mg | ORAL_TABLET | Freq: Four times a day (QID) | ORAL | 0 refills | Status: AC | PRN

## 2020-07-11 MED ORDER — FENTANYL CITRATE (PF) 100 MCG/2ML IJ SOLN
50.0000 ug | Freq: Once | INTRAMUSCULAR | Status: AC
  Administered 2020-07-11: 50 ug via INTRAVENOUS
  Filled 2020-07-11: qty 2

## 2020-07-11 MED ORDER — ONDANSETRON 4 MG PO TBDP
8.0000 mg | ORAL_TABLET | Freq: Once | ORAL | Status: DC
  Filled 2020-07-11: qty 2

## 2020-07-11 MED ORDER — ONDANSETRON HCL 4 MG/2ML IJ SOLN
4.0000 mg | Freq: Once | INTRAMUSCULAR | Status: AC
  Administered 2020-07-11: 4 mg via INTRAVENOUS
  Filled 2020-07-11: qty 2

## 2020-07-11 MED ORDER — TETRACAINE HCL 0.5 % OP SOLN
1.0000 [drp] | Freq: Once | OPHTHALMIC | Status: AC
  Administered 2020-07-11: 1 [drp] via OPHTHALMIC
  Filled 2020-07-11: qty 4

## 2020-07-11 MED ORDER — TETANUS-DIPHTH-ACELL PERTUSSIS 5-2.5-18.5 LF-MCG/0.5 IM SUSY
0.5000 mL | PREFILLED_SYRINGE | Freq: Once | INTRAMUSCULAR | Status: AC
  Administered 2020-07-11: 0.5 mL via INTRAMUSCULAR
  Filled 2020-07-11: qty 0.5

## 2020-07-11 MED ORDER — OXYCODONE-ACETAMINOPHEN 5-325 MG PO TABS
1.0000 | ORAL_TABLET | ORAL | 0 refills | Status: AC | PRN
Start: 2020-07-11 — End: ?

## 2020-07-11 MED ORDER — LIDOCAINE-EPINEPHRINE-TETRACAINE (LET) TOPICAL GEL
3.0000 mL | Freq: Once | TOPICAL | Status: AC
  Administered 2020-07-11: 3 mL via TOPICAL
  Filled 2020-07-11: qty 3

## 2020-07-11 MED ORDER — SODIUM CHLORIDE 0.9 % IV BOLUS
1000.0000 mL | Freq: Once | INTRAVENOUS | Status: AC
  Administered 2020-07-11: 1000 mL via INTRAVENOUS

## 2020-07-11 MED ORDER — ONDANSETRON 4 MG PO TBDP
4.0000 mg | ORAL_TABLET | Freq: Once | ORAL | Status: AC
  Administered 2020-07-11: 4 mg via ORAL
  Filled 2020-07-11: qty 1

## 2020-07-11 MED ORDER — CHLORHEXIDINE GLUCONATE 0.12 % MT SOLN: 15.0000 mL | Freq: Two times a day (BID) | OROMUCOSAL | 0 refills | Status: AC

## 2020-07-11 MED ORDER — HYDROMORPHONE HCL 1 MG/ML IJ SOLN
0.5000 mg | Freq: Once | INTRAMUSCULAR | Status: AC
  Administered 2020-07-11: 0.5 mg via INTRAVENOUS
  Filled 2020-07-11: qty 1

## 2020-07-11 MED ORDER — LORAZEPAM 2 MG/ML IJ SOLN: 1.0000 mg | Freq: Once | INTRAMUSCULAR | Status: AC

## 2020-07-11 MED ORDER — LORAZEPAM 2 MG/ML IJ SOLN
INTRAMUSCULAR | Status: AC
  Administered 2020-07-11: 1 mg via INTRAVENOUS
  Filled 2020-07-11: qty 1

## 2020-07-11 MED ORDER — ONDANSETRON 4 MG PO TBDP: 4.0000 mg | ORAL_TABLET | Freq: Three times a day (TID) | ORAL | 0 refills | Status: DC | PRN

## 2020-07-11 MED ORDER — FLUORESCEIN SODIUM 1 MG OP STRP
1.0000 | ORAL_STRIP | Freq: Once | OPHTHALMIC | Status: AC
  Administered 2020-07-11: 1 via OPHTHALMIC
  Filled 2020-07-11: qty 1

## 2020-07-11 NOTE — Discharge Instructions (Addendum)
As we discussed today, you have evidence of a small nasal fracture Your imaging.  He can follow-up with the referred ear nose and throat doctor.  You should avoid any blowing of your nose at this time.  You can take Tylenol or Ibuprofen as directed for pain. You can alternate Tylenol and Ibuprofen every 4 hours. If you take Tylenol at 1pm, then you can take Ibuprofen at 5pm. Then you can take Tylenol again at 9pm.   Take pain medications as directed for break through pain. Do not drive or operate machinery while taking this medication.   Take Zofran for nausea.  Use peridex as directed.   Additionally, the people of your right eye is unequal from your left eye.  Your imaging today looked reassuring.  The eye doctor has been notified of this and will plan to see you in 1 week.  Please call his office to arrange for a specific time.  Keep your wounds clean with soap and water.  Gently wash them.  He can apply Neosporin or bacitracin.  Return to the emergency department for any worsening pain, fever, drainage from the wounds, vision changes, chest pain, difficulty breathing or any other worsening concerning symptoms.
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
## 2020-07-17 DIAGNOSIS — H5704 Mydriasis: Secondary | ICD-10-CM | POA: Diagnosis not present

## 2020-08-07 DIAGNOSIS — H26101 Unspecified traumatic cataract, right eye: Secondary | ICD-10-CM | POA: Diagnosis not present

## 2020-08-07 DIAGNOSIS — H5213 Myopia, bilateral: Secondary | ICD-10-CM | POA: Diagnosis not present

## 2020-08-07 DIAGNOSIS — H5704 Mydriasis: Secondary | ICD-10-CM | POA: Diagnosis not present

## 2020-08-16 DIAGNOSIS — Z3042 Encounter for surveillance of injectable contraceptive: Secondary | ICD-10-CM | POA: Diagnosis not present

## 2020-09-19 DIAGNOSIS — L570 Actinic keratosis: Secondary | ICD-10-CM | POA: Diagnosis not present

## 2020-09-19 DIAGNOSIS — D2261 Melanocytic nevi of right upper limb, including shoulder: Secondary | ICD-10-CM | POA: Diagnosis not present

## 2020-09-19 DIAGNOSIS — Z85828 Personal history of other malignant neoplasm of skin: Secondary | ICD-10-CM | POA: Diagnosis not present

## 2020-09-19 DIAGNOSIS — D2262 Melanocytic nevi of left upper limb, including shoulder: Secondary | ICD-10-CM | POA: Diagnosis not present

## 2020-09-21 DIAGNOSIS — J45909 Unspecified asthma, uncomplicated: Secondary | ICD-10-CM | POA: Diagnosis not present

## 2020-09-21 DIAGNOSIS — F909 Attention-deficit hyperactivity disorder, unspecified type: Secondary | ICD-10-CM | POA: Diagnosis not present

## 2020-09-21 DIAGNOSIS — F419 Anxiety disorder, unspecified: Secondary | ICD-10-CM | POA: Diagnosis not present

## 2020-09-21 DIAGNOSIS — Z5181 Encounter for therapeutic drug level monitoring: Secondary | ICD-10-CM | POA: Diagnosis not present

## 2020-09-21 DIAGNOSIS — Z79899 Other long term (current) drug therapy: Secondary | ICD-10-CM | POA: Diagnosis not present

## 2020-11-10 DIAGNOSIS — Z3042 Encounter for surveillance of injectable contraceptive: Secondary | ICD-10-CM | POA: Diagnosis not present

## 2020-11-29 DIAGNOSIS — H52201 Unspecified astigmatism, right eye: Secondary | ICD-10-CM | POA: Diagnosis not present

## 2020-11-29 DIAGNOSIS — H26101 Unspecified traumatic cataract, right eye: Secondary | ICD-10-CM | POA: Diagnosis not present

## 2020-11-29 DIAGNOSIS — H5213 Myopia, bilateral: Secondary | ICD-10-CM | POA: Diagnosis not present

## 2021-01-04 DIAGNOSIS — Z79899 Other long term (current) drug therapy: Secondary | ICD-10-CM | POA: Diagnosis not present

## 2021-01-04 DIAGNOSIS — F909 Attention-deficit hyperactivity disorder, unspecified type: Secondary | ICD-10-CM | POA: Diagnosis not present

## 2021-02-02 DIAGNOSIS — Z3042 Encounter for surveillance of injectable contraceptive: Secondary | ICD-10-CM | POA: Diagnosis not present

## 2021-04-06 DIAGNOSIS — F909 Attention-deficit hyperactivity disorder, unspecified type: Secondary | ICD-10-CM | POA: Diagnosis not present

## 2021-04-06 DIAGNOSIS — Z79899 Other long term (current) drug therapy: Secondary | ICD-10-CM | POA: Diagnosis not present

## 2021-04-06 DIAGNOSIS — F419 Anxiety disorder, unspecified: Secondary | ICD-10-CM | POA: Diagnosis not present

## 2021-04-28 DIAGNOSIS — J349 Unspecified disorder of nose and nasal sinuses: Secondary | ICD-10-CM | POA: Diagnosis not present

## 2021-04-28 DIAGNOSIS — J3489 Other specified disorders of nose and nasal sinuses: Secondary | ICD-10-CM | POA: Diagnosis not present

## 2021-04-28 DIAGNOSIS — S022XXA Fracture of nasal bones, initial encounter for closed fracture: Secondary | ICD-10-CM | POA: Diagnosis not present

## 2021-06-04 DIAGNOSIS — F909 Attention-deficit hyperactivity disorder, unspecified type: Secondary | ICD-10-CM | POA: Diagnosis not present

## 2021-06-04 DIAGNOSIS — F439 Reaction to severe stress, unspecified: Secondary | ICD-10-CM | POA: Diagnosis not present

## 2021-06-04 DIAGNOSIS — F419 Anxiety disorder, unspecified: Secondary | ICD-10-CM | POA: Diagnosis not present

## 2021-06-06 DIAGNOSIS — F4322 Adjustment disorder with anxiety: Secondary | ICD-10-CM | POA: Diagnosis not present

## 2021-06-12 DIAGNOSIS — F4322 Adjustment disorder with anxiety: Secondary | ICD-10-CM | POA: Diagnosis not present

## 2021-06-14 DIAGNOSIS — F4322 Adjustment disorder with anxiety: Secondary | ICD-10-CM | POA: Diagnosis not present

## 2021-06-21 DIAGNOSIS — F439 Reaction to severe stress, unspecified: Secondary | ICD-10-CM | POA: Diagnosis not present

## 2021-06-21 DIAGNOSIS — F4322 Adjustment disorder with anxiety: Secondary | ICD-10-CM | POA: Diagnosis not present

## 2021-06-27 DIAGNOSIS — F4322 Adjustment disorder with anxiety: Secondary | ICD-10-CM | POA: Diagnosis not present

## 2021-07-05 DIAGNOSIS — F4322 Adjustment disorder with anxiety: Secondary | ICD-10-CM | POA: Diagnosis not present

## 2021-07-06 DIAGNOSIS — F909 Attention-deficit hyperactivity disorder, unspecified type: Secondary | ICD-10-CM | POA: Diagnosis not present

## 2021-07-06 DIAGNOSIS — F419 Anxiety disorder, unspecified: Secondary | ICD-10-CM | POA: Diagnosis not present

## 2021-07-06 DIAGNOSIS — F439 Reaction to severe stress, unspecified: Secondary | ICD-10-CM | POA: Diagnosis not present

## 2021-07-10 DIAGNOSIS — F4322 Adjustment disorder with anxiety: Secondary | ICD-10-CM | POA: Diagnosis not present

## 2021-07-19 DIAGNOSIS — F4322 Adjustment disorder with anxiety: Secondary | ICD-10-CM | POA: Diagnosis not present

## 2021-07-24 DIAGNOSIS — F4322 Adjustment disorder with anxiety: Secondary | ICD-10-CM | POA: Diagnosis not present

## 2021-07-25 DIAGNOSIS — F4322 Adjustment disorder with anxiety: Secondary | ICD-10-CM | POA: Diagnosis not present

## 2021-07-27 DIAGNOSIS — F4322 Adjustment disorder with anxiety: Secondary | ICD-10-CM | POA: Diagnosis not present

## 2021-08-07 DIAGNOSIS — F4322 Adjustment disorder with anxiety: Secondary | ICD-10-CM | POA: Diagnosis not present

## 2021-08-09 DIAGNOSIS — J3489 Other specified disorders of nose and nasal sinuses: Secondary | ICD-10-CM | POA: Diagnosis not present

## 2021-08-09 DIAGNOSIS — J384 Edema of larynx: Secondary | ICD-10-CM | POA: Diagnosis not present

## 2021-08-09 DIAGNOSIS — R490 Dysphonia: Secondary | ICD-10-CM | POA: Diagnosis not present

## 2021-08-09 DIAGNOSIS — J342 Deviated nasal septum: Secondary | ICD-10-CM | POA: Diagnosis not present

## 2021-08-14 DIAGNOSIS — F4322 Adjustment disorder with anxiety: Secondary | ICD-10-CM | POA: Diagnosis not present

## 2021-08-16 DIAGNOSIS — F4322 Adjustment disorder with anxiety: Secondary | ICD-10-CM | POA: Diagnosis not present

## 2021-08-21 DIAGNOSIS — F4322 Adjustment disorder with anxiety: Secondary | ICD-10-CM | POA: Diagnosis not present

## 2021-08-22 DIAGNOSIS — F4322 Adjustment disorder with anxiety: Secondary | ICD-10-CM | POA: Diagnosis not present

## 2021-08-23 DIAGNOSIS — F4322 Adjustment disorder with anxiety: Secondary | ICD-10-CM | POA: Diagnosis not present

## 2021-08-24 DIAGNOSIS — F4322 Adjustment disorder with anxiety: Secondary | ICD-10-CM | POA: Diagnosis not present

## 2021-08-27 DIAGNOSIS — F4322 Adjustment disorder with anxiety: Secondary | ICD-10-CM | POA: Diagnosis not present

## 2021-08-28 DIAGNOSIS — F4322 Adjustment disorder with anxiety: Secondary | ICD-10-CM | POA: Diagnosis not present

## 2021-08-29 DIAGNOSIS — F4322 Adjustment disorder with anxiety: Secondary | ICD-10-CM | POA: Diagnosis not present

## 2021-08-30 DIAGNOSIS — F4322 Adjustment disorder with anxiety: Secondary | ICD-10-CM | POA: Diagnosis not present

## 2021-09-04 DIAGNOSIS — F4322 Adjustment disorder with anxiety: Secondary | ICD-10-CM | POA: Diagnosis not present

## 2021-09-05 DIAGNOSIS — Z8742 Personal history of other diseases of the female genital tract: Secondary | ICD-10-CM | POA: Diagnosis not present

## 2021-09-05 DIAGNOSIS — Z3042 Encounter for surveillance of injectable contraceptive: Secondary | ICD-10-CM | POA: Diagnosis not present

## 2021-09-05 DIAGNOSIS — Z01419 Encounter for gynecological examination (general) (routine) without abnormal findings: Secondary | ICD-10-CM | POA: Diagnosis not present

## 2021-09-05 DIAGNOSIS — Z1231 Encounter for screening mammogram for malignant neoplasm of breast: Secondary | ICD-10-CM | POA: Diagnosis not present

## 2021-09-05 DIAGNOSIS — Z3202 Encounter for pregnancy test, result negative: Secondary | ICD-10-CM | POA: Diagnosis not present

## 2021-09-06 DIAGNOSIS — F4322 Adjustment disorder with anxiety: Secondary | ICD-10-CM | POA: Diagnosis not present

## 2021-09-11 DIAGNOSIS — F4322 Adjustment disorder with anxiety: Secondary | ICD-10-CM | POA: Diagnosis not present

## 2021-09-13 DIAGNOSIS — F4322 Adjustment disorder with anxiety: Secondary | ICD-10-CM | POA: Diagnosis not present

## 2021-09-18 DIAGNOSIS — F4322 Adjustment disorder with anxiety: Secondary | ICD-10-CM | POA: Diagnosis not present

## 2021-09-21 DIAGNOSIS — F4322 Adjustment disorder with anxiety: Secondary | ICD-10-CM | POA: Diagnosis not present

## 2021-09-26 DIAGNOSIS — F4322 Adjustment disorder with anxiety: Secondary | ICD-10-CM | POA: Diagnosis not present

## 2021-09-28 DIAGNOSIS — F4322 Adjustment disorder with anxiety: Secondary | ICD-10-CM | POA: Diagnosis not present

## 2021-10-04 DIAGNOSIS — F4322 Adjustment disorder with anxiety: Secondary | ICD-10-CM | POA: Diagnosis not present

## 2021-10-08 DIAGNOSIS — Z0000 Encounter for general adult medical examination without abnormal findings: Secondary | ICD-10-CM | POA: Diagnosis not present

## 2021-10-09 DIAGNOSIS — F419 Anxiety disorder, unspecified: Secondary | ICD-10-CM | POA: Diagnosis not present

## 2021-10-09 DIAGNOSIS — F909 Attention-deficit hyperactivity disorder, unspecified type: Secondary | ICD-10-CM | POA: Diagnosis not present

## 2021-10-09 DIAGNOSIS — F439 Reaction to severe stress, unspecified: Secondary | ICD-10-CM | POA: Diagnosis not present

## 2021-10-09 DIAGNOSIS — Z0000 Encounter for general adult medical examination without abnormal findings: Secondary | ICD-10-CM | POA: Diagnosis not present

## 2021-10-10 DIAGNOSIS — F4322 Adjustment disorder with anxiety: Secondary | ICD-10-CM | POA: Diagnosis not present

## 2021-10-17 DIAGNOSIS — Z3202 Encounter for pregnancy test, result negative: Secondary | ICD-10-CM | POA: Diagnosis not present

## 2021-10-17 DIAGNOSIS — Z30430 Encounter for insertion of intrauterine contraceptive device: Secondary | ICD-10-CM | POA: Diagnosis not present

## 2021-10-17 DIAGNOSIS — F4322 Adjustment disorder with anxiety: Secondary | ICD-10-CM | POA: Diagnosis not present

## 2021-10-31 DIAGNOSIS — F4322 Adjustment disorder with anxiety: Secondary | ICD-10-CM | POA: Diagnosis not present

## 2021-11-02 DIAGNOSIS — F4322 Adjustment disorder with anxiety: Secondary | ICD-10-CM | POA: Diagnosis not present

## 2021-11-07 DIAGNOSIS — F4322 Adjustment disorder with anxiety: Secondary | ICD-10-CM | POA: Diagnosis not present

## 2021-12-13 DIAGNOSIS — F4310 Post-traumatic stress disorder, unspecified: Secondary | ICD-10-CM | POA: Diagnosis not present

## 2021-12-18 DIAGNOSIS — F4310 Post-traumatic stress disorder, unspecified: Secondary | ICD-10-CM | POA: Diagnosis not present

## 2021-12-25 DIAGNOSIS — F4310 Post-traumatic stress disorder, unspecified: Secondary | ICD-10-CM | POA: Diagnosis not present

## 2022-01-08 DIAGNOSIS — F4310 Post-traumatic stress disorder, unspecified: Secondary | ICD-10-CM | POA: Diagnosis not present

## 2022-01-15 DIAGNOSIS — F4310 Post-traumatic stress disorder, unspecified: Secondary | ICD-10-CM | POA: Diagnosis not present

## 2022-01-16 DIAGNOSIS — H52203 Unspecified astigmatism, bilateral: Secondary | ICD-10-CM | POA: Diagnosis not present

## 2022-01-16 DIAGNOSIS — H5213 Myopia, bilateral: Secondary | ICD-10-CM | POA: Diagnosis not present

## 2022-01-16 DIAGNOSIS — H25811 Combined forms of age-related cataract, right eye: Secondary | ICD-10-CM | POA: Diagnosis not present

## 2022-01-22 DIAGNOSIS — F4310 Post-traumatic stress disorder, unspecified: Secondary | ICD-10-CM | POA: Diagnosis not present

## 2022-01-29 DIAGNOSIS — F4310 Post-traumatic stress disorder, unspecified: Secondary | ICD-10-CM | POA: Diagnosis not present

## 2022-02-06 DIAGNOSIS — F419 Anxiety disorder, unspecified: Secondary | ICD-10-CM | POA: Diagnosis not present

## 2022-02-06 DIAGNOSIS — F439 Reaction to severe stress, unspecified: Secondary | ICD-10-CM | POA: Diagnosis not present

## 2022-02-06 DIAGNOSIS — F909 Attention-deficit hyperactivity disorder, unspecified type: Secondary | ICD-10-CM | POA: Diagnosis not present

## 2022-02-19 DIAGNOSIS — F4310 Post-traumatic stress disorder, unspecified: Secondary | ICD-10-CM | POA: Diagnosis not present

## 2022-02-26 DIAGNOSIS — F4310 Post-traumatic stress disorder, unspecified: Secondary | ICD-10-CM | POA: Diagnosis not present

## 2022-03-05 DIAGNOSIS — F4310 Post-traumatic stress disorder, unspecified: Secondary | ICD-10-CM | POA: Diagnosis not present

## 2022-03-12 DIAGNOSIS — F4310 Post-traumatic stress disorder, unspecified: Secondary | ICD-10-CM | POA: Diagnosis not present

## 2022-03-21 DIAGNOSIS — D2272 Melanocytic nevi of left lower limb, including hip: Secondary | ICD-10-CM | POA: Diagnosis not present

## 2022-03-21 DIAGNOSIS — Z85828 Personal history of other malignant neoplasm of skin: Secondary | ICD-10-CM | POA: Diagnosis not present

## 2022-03-21 DIAGNOSIS — D2271 Melanocytic nevi of right lower limb, including hip: Secondary | ICD-10-CM | POA: Diagnosis not present

## 2022-03-21 DIAGNOSIS — Z129 Encounter for screening for malignant neoplasm, site unspecified: Secondary | ICD-10-CM | POA: Diagnosis not present

## 2022-03-21 DIAGNOSIS — L570 Actinic keratosis: Secondary | ICD-10-CM | POA: Diagnosis not present

## 2022-03-21 DIAGNOSIS — D485 Neoplasm of uncertain behavior of skin: Secondary | ICD-10-CM | POA: Diagnosis not present

## 2022-04-02 DIAGNOSIS — F4310 Post-traumatic stress disorder, unspecified: Secondary | ICD-10-CM | POA: Diagnosis not present

## 2022-04-09 DIAGNOSIS — F4310 Post-traumatic stress disorder, unspecified: Secondary | ICD-10-CM | POA: Diagnosis not present

## 2022-04-16 DIAGNOSIS — F4310 Post-traumatic stress disorder, unspecified: Secondary | ICD-10-CM | POA: Diagnosis not present

## 2022-04-30 DIAGNOSIS — F4310 Post-traumatic stress disorder, unspecified: Secondary | ICD-10-CM | POA: Diagnosis not present

## 2022-05-09 DIAGNOSIS — F439 Reaction to severe stress, unspecified: Secondary | ICD-10-CM | POA: Diagnosis not present

## 2022-05-09 DIAGNOSIS — F909 Attention-deficit hyperactivity disorder, unspecified type: Secondary | ICD-10-CM | POA: Diagnosis not present

## 2022-05-09 DIAGNOSIS — F419 Anxiety disorder, unspecified: Secondary | ICD-10-CM | POA: Diagnosis not present

## 2022-05-09 DIAGNOSIS — J45909 Unspecified asthma, uncomplicated: Secondary | ICD-10-CM | POA: Diagnosis not present

## 2022-06-11 DIAGNOSIS — F4310 Post-traumatic stress disorder, unspecified: Secondary | ICD-10-CM | POA: Diagnosis not present

## 2022-07-25 DIAGNOSIS — R059 Cough, unspecified: Secondary | ICD-10-CM | POA: Diagnosis not present

## 2022-07-25 DIAGNOSIS — J101 Influenza due to other identified influenza virus with other respiratory manifestations: Secondary | ICD-10-CM | POA: Diagnosis not present

## 2022-07-25 DIAGNOSIS — B349 Viral infection, unspecified: Secondary | ICD-10-CM | POA: Diagnosis not present

## 2022-07-25 DIAGNOSIS — R509 Fever, unspecified: Secondary | ICD-10-CM | POA: Diagnosis not present

## 2022-07-30 DIAGNOSIS — F4310 Post-traumatic stress disorder, unspecified: Secondary | ICD-10-CM | POA: Diagnosis not present

## 2022-08-05 DIAGNOSIS — F439 Reaction to severe stress, unspecified: Secondary | ICD-10-CM | POA: Diagnosis not present

## 2022-08-05 DIAGNOSIS — F909 Attention-deficit hyperactivity disorder, unspecified type: Secondary | ICD-10-CM | POA: Diagnosis not present

## 2022-08-05 DIAGNOSIS — F419 Anxiety disorder, unspecified: Secondary | ICD-10-CM | POA: Diagnosis not present

## 2022-08-05 DIAGNOSIS — J45909 Unspecified asthma, uncomplicated: Secondary | ICD-10-CM | POA: Diagnosis not present

## 2022-09-03 DIAGNOSIS — F4310 Post-traumatic stress disorder, unspecified: Secondary | ICD-10-CM | POA: Diagnosis not present

## 2022-09-17 DIAGNOSIS — F4310 Post-traumatic stress disorder, unspecified: Secondary | ICD-10-CM | POA: Diagnosis not present
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
## 2022-10-15 DIAGNOSIS — F4310 Post-traumatic stress disorder, unspecified: Secondary | ICD-10-CM | POA: Diagnosis not present

## 2022-10-28 DIAGNOSIS — Z0000 Encounter for general adult medical examination without abnormal findings: Secondary | ICD-10-CM | POA: Diagnosis not present

## 2022-10-29 DIAGNOSIS — F4310 Post-traumatic stress disorder, unspecified: Secondary | ICD-10-CM | POA: Diagnosis not present

## 2022-11-06 DIAGNOSIS — Z0000 Encounter for general adult medical examination without abnormal findings: Secondary | ICD-10-CM | POA: Diagnosis not present

## 2022-12-03 DIAGNOSIS — F4310 Post-traumatic stress disorder, unspecified: Secondary | ICD-10-CM | POA: Diagnosis not present

## 2022-12-17 DIAGNOSIS — F4310 Post-traumatic stress disorder, unspecified: Secondary | ICD-10-CM | POA: Diagnosis not present

## 2022-12-31 DIAGNOSIS — F4310 Post-traumatic stress disorder, unspecified: Secondary | ICD-10-CM | POA: Diagnosis not present

## 2023-01-14 DIAGNOSIS — F4310 Post-traumatic stress disorder, unspecified: Secondary | ICD-10-CM | POA: Diagnosis not present

## 2023-01-16 DIAGNOSIS — R300 Dysuria: Secondary | ICD-10-CM | POA: Diagnosis not present

## 2023-01-16 DIAGNOSIS — Z30431 Encounter for routine checking of intrauterine contraceptive device: Secondary | ICD-10-CM | POA: Diagnosis not present

## 2023-01-16 DIAGNOSIS — N760 Acute vaginitis: Secondary | ICD-10-CM | POA: Diagnosis not present

## 2023-01-16 DIAGNOSIS — Z6820 Body mass index (BMI) 20.0-20.9, adult: Secondary | ICD-10-CM | POA: Diagnosis not present

## 2023-01-16 DIAGNOSIS — Z113 Encounter for screening for infections with a predominantly sexual mode of transmission: Secondary | ICD-10-CM | POA: Diagnosis not present

## 2023-02-10 DIAGNOSIS — Z6821 Body mass index (BMI) 21.0-21.9, adult: Secondary | ICD-10-CM | POA: Diagnosis not present

## 2023-02-10 DIAGNOSIS — Z01419 Encounter for gynecological examination (general) (routine) without abnormal findings: Secondary | ICD-10-CM | POA: Diagnosis not present

## 2023-02-10 DIAGNOSIS — Z1231 Encounter for screening mammogram for malignant neoplasm of breast: Secondary | ICD-10-CM | POA: Diagnosis not present

## 2023-02-13 DIAGNOSIS — N3000 Acute cystitis without hematuria: Secondary | ICD-10-CM | POA: Diagnosis not present

## 2023-02-13 DIAGNOSIS — F909 Attention-deficit hyperactivity disorder, unspecified type: Secondary | ICD-10-CM | POA: Diagnosis not present

## 2023-02-13 DIAGNOSIS — F419 Anxiety disorder, unspecified: Secondary | ICD-10-CM | POA: Diagnosis not present

## 2023-02-25 DIAGNOSIS — F4310 Post-traumatic stress disorder, unspecified: Secondary | ICD-10-CM | POA: Diagnosis not present

## 2023-03-10 DIAGNOSIS — H25011 Cortical age-related cataract, right eye: Secondary | ICD-10-CM | POA: Diagnosis not present

## 2023-03-10 DIAGNOSIS — H04123 Dry eye syndrome of bilateral lacrimal glands: Secondary | ICD-10-CM | POA: Diagnosis not present

## 2023-03-10 DIAGNOSIS — H5213 Myopia, bilateral: Secondary | ICD-10-CM | POA: Diagnosis not present

## 2023-03-11 DIAGNOSIS — J45998 Other asthma: Secondary | ICD-10-CM | POA: Diagnosis not present

## 2023-03-11 DIAGNOSIS — F419 Anxiety disorder, unspecified: Secondary | ICD-10-CM | POA: Diagnosis not present

## 2023-03-11 DIAGNOSIS — K219 Gastro-esophageal reflux disease without esophagitis: Secondary | ICD-10-CM | POA: Diagnosis not present

## 2023-03-11 DIAGNOSIS — Z1211 Encounter for screening for malignant neoplasm of colon: Secondary | ICD-10-CM | POA: Diagnosis not present

## 2023-03-11 DIAGNOSIS — F4310 Post-traumatic stress disorder, unspecified: Secondary | ICD-10-CM | POA: Diagnosis not present

## 2023-03-18 DIAGNOSIS — D2239 Melanocytic nevi of other parts of face: Secondary | ICD-10-CM | POA: Diagnosis not present

## 2023-03-18 DIAGNOSIS — Z85828 Personal history of other malignant neoplasm of skin: Secondary | ICD-10-CM | POA: Diagnosis not present

## 2023-03-18 DIAGNOSIS — D225 Melanocytic nevi of trunk: Secondary | ICD-10-CM | POA: Diagnosis not present

## 2023-03-18 DIAGNOSIS — D2271 Melanocytic nevi of right lower limb, including hip: Secondary | ICD-10-CM | POA: Diagnosis not present

## 2023-03-25 DIAGNOSIS — F4310 Post-traumatic stress disorder, unspecified: Secondary | ICD-10-CM | POA: Diagnosis not present

## 2023-04-08 DIAGNOSIS — F4310 Post-traumatic stress disorder, unspecified: Secondary | ICD-10-CM | POA: Diagnosis not present

## 2023-04-22 DIAGNOSIS — F4310 Post-traumatic stress disorder, unspecified: Secondary | ICD-10-CM | POA: Diagnosis not present

## 2023-05-06 DIAGNOSIS — F4310 Post-traumatic stress disorder, unspecified: Secondary | ICD-10-CM | POA: Diagnosis not present

## 2023-05-20 DIAGNOSIS — F909 Attention-deficit hyperactivity disorder, unspecified type: Secondary | ICD-10-CM | POA: Diagnosis not present

## 2023-05-20 DIAGNOSIS — J45909 Unspecified asthma, uncomplicated: Secondary | ICD-10-CM | POA: Diagnosis not present

## 2023-05-20 DIAGNOSIS — Z23 Encounter for immunization: Secondary | ICD-10-CM | POA: Diagnosis not present

## 2023-05-20 DIAGNOSIS — F419 Anxiety disorder, unspecified: Secondary | ICD-10-CM | POA: Diagnosis not present

## 2023-06-03 DIAGNOSIS — F4310 Post-traumatic stress disorder, unspecified: Secondary | ICD-10-CM | POA: Diagnosis not present

## 2023-08-05 DIAGNOSIS — F4310 Post-traumatic stress disorder, unspecified: Secondary | ICD-10-CM | POA: Diagnosis not present

## 2023-09-09 DIAGNOSIS — F4310 Post-traumatic stress disorder, unspecified: Secondary | ICD-10-CM | POA: Diagnosis not present

## 2023-09-30 DIAGNOSIS — F909 Attention-deficit hyperactivity disorder, unspecified type: Secondary | ICD-10-CM | POA: Diagnosis not present

## 2023-09-30 DIAGNOSIS — F419 Anxiety disorder, unspecified: Secondary | ICD-10-CM | POA: Diagnosis not present

## 2023-10-21 DIAGNOSIS — F4310 Post-traumatic stress disorder, unspecified: Secondary | ICD-10-CM | POA: Diagnosis not present

## 2023-12-16 DIAGNOSIS — F4310 Post-traumatic stress disorder, unspecified: Secondary | ICD-10-CM | POA: Diagnosis not present

## 2024-01-13 DIAGNOSIS — F4310 Post-traumatic stress disorder, unspecified: Secondary | ICD-10-CM | POA: Diagnosis not present

## 2024-04-01 DIAGNOSIS — F909 Attention-deficit hyperactivity disorder, unspecified type: Secondary | ICD-10-CM | POA: Diagnosis not present

## 2024-04-01 DIAGNOSIS — Z0000 Encounter for general adult medical examination without abnormal findings: Secondary | ICD-10-CM | POA: Diagnosis not present

## 2024-04-01 DIAGNOSIS — F419 Anxiety disorder, unspecified: Secondary | ICD-10-CM | POA: Diagnosis not present

## 2024-04-07 DIAGNOSIS — F909 Attention-deficit hyperactivity disorder, unspecified type: Secondary | ICD-10-CM | POA: Diagnosis not present

## 2024-04-07 DIAGNOSIS — F419 Anxiety disorder, unspecified: Secondary | ICD-10-CM | POA: Diagnosis not present

## 2024-04-07 DIAGNOSIS — Z0000 Encounter for general adult medical examination without abnormal findings: Secondary | ICD-10-CM | POA: Diagnosis not present

## 2024-04-13 DIAGNOSIS — F4310 Post-traumatic stress disorder, unspecified: Secondary | ICD-10-CM | POA: Diagnosis not present

## 2024-04-22 DIAGNOSIS — F4310 Post-traumatic stress disorder, unspecified: Secondary | ICD-10-CM | POA: Diagnosis not present

## 2024-04-26 DIAGNOSIS — F321 Major depressive disorder, single episode, moderate: Secondary | ICD-10-CM | POA: Diagnosis not present

## 2024-04-26 DIAGNOSIS — F902 Attention-deficit hyperactivity disorder, combined type: Secondary | ICD-10-CM | POA: Diagnosis not present

## 2024-05-10 DIAGNOSIS — F902 Attention-deficit hyperactivity disorder, combined type: Secondary | ICD-10-CM | POA: Diagnosis not present

## 2024-05-10 DIAGNOSIS — F321 Major depressive disorder, single episode, moderate: Secondary | ICD-10-CM | POA: Diagnosis not present

## 2024-05-18 DIAGNOSIS — F4310 Post-traumatic stress disorder, unspecified: Secondary | ICD-10-CM | POA: Diagnosis not present

## 2024-07-05 DIAGNOSIS — Z1231 Encounter for screening mammogram for malignant neoplasm of breast: Secondary | ICD-10-CM | POA: Diagnosis not present

## 2024-07-05 DIAGNOSIS — Z01419 Encounter for gynecological examination (general) (routine) without abnormal findings: Secondary | ICD-10-CM | POA: Diagnosis not present

## 2024-07-07 ENCOUNTER — Other Ambulatory Visit: Payer: Self-pay | Admitting: Obstetrics

## 2024-07-07 DIAGNOSIS — R928 Other abnormal and inconclusive findings on diagnostic imaging of breast: Secondary | ICD-10-CM

## 2024-07-09 ENCOUNTER — Ambulatory Visit

## 2024-07-09 ENCOUNTER — Ambulatory Visit
Admission: RE | Admit: 2024-07-09 | Discharge: 2024-07-09 | Disposition: A | Discharge status: Home / Self Care | Source: Ambulatory Visit | Attending: Obstetrics | Admitting: Obstetrics

## 2024-07-09 DIAGNOSIS — R928 Other abnormal and inconclusive findings on diagnostic imaging of breast: Secondary | ICD-10-CM | POA: Diagnosis not present

## 2024-07-20 ENCOUNTER — Encounter

## 2024-07-20 ENCOUNTER — Other Ambulatory Visit
